# Patient Record
Sex: Female | Born: 1950 | Race: Black or African American | Hispanic: No | Marital: Married | Smoking: Former smoker
Health system: Southern US, Community
[De-identification: ages and names within clinical notes are randomized; demographics above are authoritative.]

## PROBLEM LIST (undated history)

## (undated) DIAGNOSIS — K219 Gastro-esophageal reflux disease without esophagitis: Secondary | ICD-10-CM

## (undated) DIAGNOSIS — M1990 Unspecified osteoarthritis, unspecified site: Secondary | ICD-10-CM

## (undated) DIAGNOSIS — E785 Hyperlipidemia, unspecified: Secondary | ICD-10-CM

## (undated) HISTORY — PX: TUBAL LIGATION: SHX77

## (undated) HISTORY — PX: TONSILLECTOMY: SUR1361

---

## 1999-04-20 ENCOUNTER — Emergency Department (HOSPITAL_COMMUNITY): Admission: EM | Admit: 1999-04-20 | Discharge: 1999-04-20 | Payer: Self-pay | Admitting: Emergency Medicine

## 2000-03-04 ENCOUNTER — Ambulatory Visit (HOSPITAL_COMMUNITY): Admission: RE | Admit: 2000-03-04 | Discharge: 2000-03-04 | Payer: Self-pay | Admitting: Family Medicine

## 2000-03-04 ENCOUNTER — Encounter: Payer: Self-pay | Admitting: Family Medicine

## 2003-12-22 ENCOUNTER — Other Ambulatory Visit: Admission: RE | Admit: 2003-12-22 | Discharge: 2003-12-22 | Payer: Self-pay | Admitting: Family Medicine

## 2004-01-19 ENCOUNTER — Ambulatory Visit (HOSPITAL_COMMUNITY): Admission: RE | Admit: 2004-01-19 | Discharge: 2004-01-19 | Payer: Self-pay | Admitting: Family Medicine

## 2004-03-27 ENCOUNTER — Ambulatory Visit (HOSPITAL_COMMUNITY): Admission: RE | Admit: 2004-03-27 | Discharge: 2004-03-27 | Payer: Self-pay | Admitting: Gastroenterology

## 2005-02-22 ENCOUNTER — Emergency Department (HOSPITAL_COMMUNITY): Admission: EM | Admit: 2005-02-22 | Discharge: 2005-02-22 | Payer: Self-pay | Admitting: Emergency Medicine

## 2005-03-12 ENCOUNTER — Other Ambulatory Visit: Admission: RE | Admit: 2005-03-12 | Discharge: 2005-03-12 | Payer: Self-pay | Admitting: Family Medicine

## 2006-07-28 ENCOUNTER — Other Ambulatory Visit: Admission: RE | Admit: 2006-07-28 | Discharge: 2006-07-28 | Payer: Self-pay | Admitting: Family Medicine

## 2007-07-30 ENCOUNTER — Other Ambulatory Visit: Admission: RE | Admit: 2007-07-30 | Discharge: 2007-07-30 | Payer: Self-pay | Admitting: Family Medicine

## 2007-08-10 ENCOUNTER — Ambulatory Visit (HOSPITAL_COMMUNITY): Admission: RE | Admit: 2007-08-10 | Discharge: 2007-08-10 | Payer: Self-pay | Admitting: Family Medicine

## 2010-03-18 ENCOUNTER — Encounter: Admission: RE | Admit: 2010-03-18 | Discharge: 2010-03-18 | Payer: Self-pay | Admitting: Advanced Practice Midwife

## 2011-02-03 ENCOUNTER — Other Ambulatory Visit: Payer: Self-pay | Admitting: Family Medicine

## 2011-02-03 ENCOUNTER — Other Ambulatory Visit (HOSPITAL_COMMUNITY): Payer: Self-pay | Admitting: Family Medicine

## 2011-02-03 ENCOUNTER — Other Ambulatory Visit (HOSPITAL_COMMUNITY)
Admission: RE | Admit: 2011-02-03 | Discharge: 2011-02-03 | Disposition: A | Discharge status: Home / Self Care | Payer: BC Managed Care – PPO | Source: Ambulatory Visit | Attending: Family Medicine | Admitting: Family Medicine

## 2011-02-03 DIAGNOSIS — Z124 Encounter for screening for malignant neoplasm of cervix: Secondary | ICD-10-CM | POA: Insufficient documentation

## 2011-02-03 DIAGNOSIS — Z1231 Encounter for screening mammogram for malignant neoplasm of breast: Secondary | ICD-10-CM

## 2011-02-05 ENCOUNTER — Ambulatory Visit (HOSPITAL_COMMUNITY)
Admission: RE | Admit: 2011-02-05 | Discharge: 2011-02-05 | Disposition: A | Discharge status: Home / Self Care | Payer: BC Managed Care – PPO | Source: Ambulatory Visit | Attending: Family Medicine | Admitting: Family Medicine

## 2011-02-05 DIAGNOSIS — Z1231 Encounter for screening mammogram for malignant neoplasm of breast: Secondary | ICD-10-CM | POA: Insufficient documentation

## 2011-02-21 NOTE — Op Note (Signed)
NAME:  Lori, Shah                             ACCOUNT NO.:  [id number]   MEDICAL RECORD NO.:  [id number]                   PATIENT TYPE:  AMB   LOCATION:  ENDO                                 FACILITY:  [facility]   PHYSICIAN:  Anselmo Rod, M.D.               DATE OF BIRTH:  June 05, 1951   DATE OF PROCEDURE:  03/27/2004  DATE OF DISCHARGE:                                 OPERATIVE REPORT   PROCEDURE PERFORMED:  Screening colonoscopy.   ENDOSCOPIST:  Charna Elizabeth, M.D.   INSTRUMENT USED:  Olympus video colonoscope.   INDICATIONS FOR PROCEDURE:  Guaiac positive stool in a 60 year old [place]-  American female.  Rule out colonic polyps, masses, etc.   PREPROCEDURE PREPARATION:  Informed consent was procured from the patient.  The patient was fasted for eight hours prior to the procedure and prepped  with a bottle of magnesium citrate and a gallon of GoLYTELY the night prior  to the procedure.   PREPROCEDURE PHYSICAL:  The patient had stable vital signs.  Neck supple.  Chest clear to auscultation.  S1 and S2 regular.  Abdomen soft with normal  bowel sounds.   DESCRIPTION OF PROCEDURE:  The patient was placed in left lateral decubitus  position and sedated with 50 mg of Demerol and 7 mg of Versed intravenously.  Once the patient was adequately sedated and maintained on low flow oxygen  and continuous cardiac monitoring, the Olympus video colonoscope was  advanced from the rectum to the cecum.  The appendicular orifice and  ileocecal valve were clearly visualized and photographed.  No masses,  polyps, erosions, ulcerations or diverticula were seen.  Retroflexion in the  rectum revealed no abnormalities.   IMPRESSION:  Normal colonoscopy up to the cecum.   RECOMMENDATIONS:  1. Repeat colonoscopy is recommended in the next 10 years unless the patient     develops any abnormal symptoms     in the interim.  2. Outpatient followup in the next two weeks for repeat guaiac testing,  further recommendations will be made at that time.                                               Anselmo Rod, M.D.    JNM/MEDQ  D:  03/27/2004  T:  03/28/2004  Job:  [id number]   cc:   Gretta Arab. Valentina Lucks, M.D.  [address] [place]  [place] [address]  Fax: [phone]

## 2011-10-25 ENCOUNTER — Encounter (HOSPITAL_COMMUNITY): Payer: Self-pay

## 2011-10-25 ENCOUNTER — Observation Stay (HOSPITAL_COMMUNITY)
Admission: EM | Admit: 2011-10-25 | Discharge: 2011-10-25 | Disposition: A | Discharge status: Home / Self Care | Payer: BC Managed Care – PPO | Attending: Emergency Medicine | Admitting: Emergency Medicine

## 2011-10-25 DIAGNOSIS — R3589 Other polyuria: Secondary | ICD-10-CM | POA: Insufficient documentation

## 2011-10-25 DIAGNOSIS — R631 Polydipsia: Secondary | ICD-10-CM | POA: Insufficient documentation

## 2011-10-25 DIAGNOSIS — R739 Hyperglycemia, unspecified: Secondary | ICD-10-CM

## 2011-10-25 DIAGNOSIS — R7309 Other abnormal glucose: Principal | ICD-10-CM | POA: Insufficient documentation

## 2011-10-25 DIAGNOSIS — R358 Other polyuria: Secondary | ICD-10-CM | POA: Insufficient documentation

## 2011-10-25 LAB — URINALYSIS, ROUTINE W REFLEX MICROSCOPIC
Bilirubin Urine: NEGATIVE
Ketones, ur: 40 mg/dL — AB
Nitrite: NEGATIVE
Protein, ur: NEGATIVE mg/dL
Urobilinogen, UA: 0.2 mg/dL (ref 0.0–1.0)
pH: 5.5 (ref 5.0–8.0)

## 2011-10-25 LAB — CBC
HCT: 41.6 % (ref 36.0–46.0)
Hemoglobin: 14.9 g/dL (ref 12.0–15.0)
MCH: 28.8 pg (ref 26.0–34.0)
MCHC: 35.8 g/dL (ref 30.0–36.0)
RDW: 12.1 % (ref 11.5–15.5)

## 2011-10-25 LAB — URINE CULTURE: Culture  Setup Time: 201301191959

## 2011-10-25 LAB — GLUCOSE, CAPILLARY
Glucose-Capillary: 298 mg/dL — ABNORMAL HIGH (ref 70–99)
Glucose-Capillary: 233 mg/dL — ABNORMAL HIGH (ref 70–99)
Glucose-Capillary: 179 mg/dL — ABNORMAL HIGH (ref 70–99)

## 2011-10-25 LAB — BASIC METABOLIC PANEL
BUN: 11 mg/dL (ref 6–23)
Calcium: 9.8 mg/dL (ref 8.4–10.5)
Creatinine, Ser: 0.86 mg/dL (ref 0.50–1.10)
GFR calc Af Amer: 82 mL/min — ABNORMAL LOW (ref >90)
GFR calc non Af Amer: 71 mL/min — ABNORMAL LOW (ref >90)

## 2011-10-25 LAB — DIFFERENTIAL
Basophils Absolute: 0 10*3/uL (ref 0.0–0.1)
Basophils Relative: 0 % (ref 0–1)
Eosinophils Absolute: 0.1 10*3/uL (ref 0.0–0.7)
Eosinophils Relative: 1 % (ref 0–5)
Monocytes Absolute: 0.4 10*3/uL (ref 0.1–1.0)
Monocytes Relative: 5 % (ref 3–12)

## 2011-10-25 LAB — URINE MICROSCOPIC-ADD ON

## 2011-10-25 LAB — GLUCOSE, RANDOM: Glucose, Bld: 418 mg/dL — ABNORMAL HIGH (ref 70–99)

## 2011-10-25 MED ORDER — ONDANSETRON HCL 4 MG/2ML IJ SOLN: 4.0000 mg | Freq: Four times a day (QID) | INTRAMUSCULAR | Status: DC | PRN

## 2011-10-25 MED ORDER — SODIUM CHLORIDE 0.9 % IV BOLUS (SEPSIS)
1000.0000 mL | Freq: Once | INTRAVENOUS | Status: AC
  Administered 2011-10-25: 1000 mL via INTRAVENOUS

## 2011-10-25 MED ORDER — INSULIN ASPART 100 UNIT/ML ~~LOC~~ SOLN
10.0000 [IU] | Freq: Once | SUBCUTANEOUS | Status: AC
  Administered 2011-10-25: 10 [IU] via SUBCUTANEOUS
  Filled 2011-10-25 (×2): qty 1

## 2011-10-25 MED ORDER — ONDANSETRON 8 MG/NS 50 ML IVPB
8.0000 mg | INTRAVENOUS | Status: AC
  Administered 2011-10-25: 8 mg via INTRAVENOUS
  Filled 2011-10-25: qty 8

## 2011-10-25 MED ORDER — METFORMIN HCL 1000 MG PO TABS: 500.0000 mg | ORAL_TABLET | Freq: Two times a day (BID) | ORAL | Status: DC

## 2011-10-25 MED ORDER — GI COCKTAIL ~~LOC~~
30.0000 mL | Freq: Three times a day (TID) | ORAL | Status: DC | PRN
  Administered 2011-10-25: 30 mL via ORAL
  Filled 2011-10-25: qty 30

## 2011-10-25 MED ORDER — SODIUM CHLORIDE 0.9 % IV SOLN
INTRAVENOUS | Status: DC
  Administered 2011-10-25: 3.6 [IU]/h via INTRAVENOUS
  Filled 2011-10-25: qty 1

## 2011-10-25 MED ORDER — ONDANSETRON 8 MG/NS 50 ML IVPB: 8.0000 mg | Freq: Three times a day (TID) | INTRAVENOUS | Status: DC | PRN

## 2011-10-25 MED ORDER — SODIUM CHLORIDE 0.9 % IV BOLUS (SEPSIS)
250.0000 mL | Freq: Once | INTRAVENOUS | Status: AC
  Administered 2011-10-25: 1000 mL via INTRAVENOUS

## 2011-10-25 MED ORDER — SODIUM CHLORIDE 0.9 % IV SOLN: Freq: Once | INTRAVENOUS | Status: DC

## 2011-10-25 NOTE — ED Notes (Signed)
Glucostabilizer Dc'd per PA Green due to pt glucose 179

## 2011-10-25 NOTE — ED Notes (Signed)
Verified insulin rate 3.6 units/hr with Dorice Lamas, RN

## 2011-10-25 NOTE — ED Notes (Signed)
Reported off to [facility] at CDU.  Patient transferred to CDU and explained the reason.

## 2011-10-25 NOTE — ED Notes (Addendum)
1725 - verified with Dorice Lamas, RN, - Insulin rate change to 2.4 units/hr as per glucose stabilizer

## 2011-10-25 NOTE — ED Provider Notes (Signed)
History     CSN: [id number]  Arrival date & time 10/25/11  1318   First MD Initiated Contact with Patient 10/25/11 1406      No chief complaint on file.   (Consider location/radiation/quality/duration/timing/severity/associated sxs/prior treatment) The history is provided by the patient.   Patient here with polyuria, polydipsia. Symptoms have been for the past 3 weeks saw her physician today was noted to be hyperglycemic in the office and patient was transported here. Patient has history of recent vaginal yeast infection. Also notes diffuse whole-body weakness. No fever, vomiting, diarrhea. No chest abdominal pain. Slight headache without photophobia. No prior history of diabetes. Nothing makes her symptoms better or worse no medications used prior to arrival History reviewed. No pertinent past medical history.  History reviewed. No pertinent past surgical history.  History reviewed. No pertinent family history.  History  Substance Use Topics  . Smoking status: Not on file  . Smokeless tobacco: Not on file  . Alcohol Use: Not on file    OB History    Grav Para Term Preterm Abortions TAB SAB Ect Mult Living                  Review of Systems  All other systems reviewed and are negative.    Allergies  Review of patient's allergies indicates not on file.  Home Medications  No current outpatient prescriptions on file.  BP 119/73  Pulse 85  Temp(Src) 98.2 F (36.8 C) (Oral)  Resp 18  SpO2 98%  Physical Exam  Nursing note and vitals reviewed. Constitutional: She is oriented to person, place, and time. Vital signs are normal. She appears well-developed and well-nourished.  Non-toxic appearance. No distress.  HENT:  Head: Normocephalic and atraumatic.  Eyes: Conjunctivae, EOM and lids are normal. Pupils are equal, round, and reactive to light.  Neck: Normal range of motion. Neck supple. No tracheal deviation present. No mass present.  Cardiovascular: Normal rate,  regular rhythm and normal heart sounds.  Exam reveals no gallop.   No murmur heard. Pulmonary/Chest: Effort normal and breath sounds normal. No stridor. No respiratory distress. She has no decreased breath sounds. She has no wheezes. She has no rhonchi. She has no rales.  Abdominal: Soft. Normal appearance and bowel sounds are normal. She exhibits no distension. There is no tenderness. There is no rebound and no CVA tenderness.  Musculoskeletal: Normal range of motion. She exhibits no edema and no tenderness.  Neurological: She is alert and oriented to person, place, and time. She has normal strength. No cranial nerve deficit or sensory deficit. GCS eye subscore is 4. GCS verbal subscore is 5. GCS motor subscore is 6.  Skin: Skin is warm and dry. No abrasion and no rash noted.  Psychiatric: She has a normal mood and affect. Her speech is normal and behavior is normal.    ED Course  Procedures (including critical care time)  Labs Reviewed  GLUCOSE, CAPILLARY - Abnormal; Notable for the following:    Glucose-Capillary 517 (*)    All other components within normal limits  CBC  DIFFERENTIAL  BASIC METABOLIC PANEL  URINALYSIS, ROUTINE W REFLEX MICROSCOPIC  URINE CULTURE   No results found.   No diagnosis found.    MDM  Pt placed on hyperglycemia protocol in cdu and will be given iv fluids and will be discharged on metformin when blood sugar is controlled        Toy Baker, MD 10/28/11 [phone]

## 2011-10-25 NOTE — ED Notes (Signed)
Sent from md for elevated blood sugar, this week dizzy and white tongue, no hx of diabetes.

## 2011-10-25 NOTE — ED Notes (Signed)
Received report on Lori Shah from [employer], Charity fundraiser. Pt coming to cdu#9.

## 2011-10-25 NOTE — ED Notes (Signed)
Insulin drip and rate of infusion verified by Dionicio Stall, RN and Dorice Lamas, RN.

## 2011-10-25 NOTE — ED Notes (Signed)
Dc instructions given and understanding verbalized 

## 2011-10-25 NOTE — ED Provider Notes (Signed)
The patient was placed in the CDU by Dr. Bruce Donath. Patient is here for hyperglycemia protocol. Patient has been tolerating treatment well during her stay in the CDU. Her blood sugar is now 179 by cbg. Patient is now asymptomatic and says she feels great. Will start patient on metformin 500 mg twice a day and is to followup with her primary care doctor on Monday.   Dorthula Matas, PA 10/25/11 1958

## 2012-03-27 ENCOUNTER — Encounter (HOSPITAL_COMMUNITY): Payer: Self-pay | Admitting: Emergency Medicine

## 2012-03-27 ENCOUNTER — Emergency Department (HOSPITAL_COMMUNITY): Payer: Self-pay

## 2012-03-27 ENCOUNTER — Emergency Department (HOSPITAL_COMMUNITY)
Admission: EM | Admit: 2012-03-27 | Discharge: 2012-03-27 | Disposition: A | Discharge status: Home / Self Care | Payer: Self-pay | Attending: Emergency Medicine | Admitting: Emergency Medicine

## 2012-03-27 DIAGNOSIS — IMO0002 Reserved for concepts with insufficient information to code with codable children: Secondary | ICD-10-CM

## 2012-03-27 DIAGNOSIS — S91109A Unspecified open wound of unspecified toe(s) without damage to nail, initial encounter: Secondary | ICD-10-CM | POA: Insufficient documentation

## 2012-03-27 DIAGNOSIS — Z880 Allergy status to penicillin: Secondary | ICD-10-CM | POA: Insufficient documentation

## 2012-03-27 DIAGNOSIS — F17200 Nicotine dependence, unspecified, uncomplicated: Secondary | ICD-10-CM | POA: Insufficient documentation

## 2012-03-27 DIAGNOSIS — S92911A Unspecified fracture of right toe(s), initial encounter for closed fracture: Secondary | ICD-10-CM

## 2012-03-27 DIAGNOSIS — E1149 Type 2 diabetes mellitus with other diabetic neurological complication: Secondary | ICD-10-CM | POA: Insufficient documentation

## 2012-03-27 DIAGNOSIS — E1142 Type 2 diabetes mellitus with diabetic polyneuropathy: Secondary | ICD-10-CM | POA: Insufficient documentation

## 2012-03-27 DIAGNOSIS — M2010 Hallux valgus (acquired), unspecified foot: Secondary | ICD-10-CM | POA: Insufficient documentation

## 2012-03-27 DIAGNOSIS — S92919B Unspecified fracture of unspecified toe(s), initial encounter for open fracture: Secondary | ICD-10-CM | POA: Insufficient documentation

## 2012-03-27 DIAGNOSIS — W208XXA Other cause of strike by thrown, projected or falling object, initial encounter: Secondary | ICD-10-CM | POA: Insufficient documentation

## 2012-03-27 MED ORDER — BACITRACIN 500 UNIT/GM EX OINT
1.0000 "application " | TOPICAL_OINTMENT | Freq: Two times a day (BID) | CUTANEOUS | Status: DC
  Filled 2012-03-27: qty 0.9

## 2012-03-27 MED ORDER — BACITRACIN ZINC 500 UNIT/GM EX OINT
TOPICAL_OINTMENT | Freq: Two times a day (BID) | CUTANEOUS | Status: DC
  Filled 2012-03-27: qty 15

## 2012-03-27 MED ORDER — CHLORHEXIDINE GLUCONATE 4 % EX LIQD
Freq: Two times a day (BID) | CUTANEOUS | Status: DC
  Administered 2012-03-27: 17:00:00 via TOPICAL
  Filled 2012-03-27: qty 15

## 2012-03-27 MED ORDER — INDOMETHACIN 25 MG PO CAPS
50.0000 mg | ORAL_CAPSULE | Freq: Once | ORAL | Status: AC
  Administered 2012-03-27: 50 mg via ORAL
  Filled 2012-03-27: qty 1
  Filled 2012-03-27: qty 2

## 2012-03-27 MED ORDER — CEPHALEXIN 250 MG PO CAPS
500.0000 mg | ORAL_CAPSULE | Freq: Once | ORAL | Status: AC
  Administered 2012-03-27: 500 mg via ORAL
  Filled 2012-03-27: qty 2

## 2012-03-27 MED ORDER — CEPHALEXIN 500 MG PO CAPS: 500.0000 mg | ORAL_CAPSULE | Freq: Four times a day (QID) | ORAL | Status: AC

## 2012-03-27 MED ORDER — HYDROCODONE-ACETAMINOPHEN 5-325 MG PO TABS
2.0000 | ORAL_TABLET | Freq: Once | ORAL | Status: AC
  Administered 2012-03-27: 2 via ORAL
  Filled 2012-03-27: qty 2

## 2012-03-27 MED ORDER — CHLORHEXIDINE GLUCONATE 4 % EX LIQD: Freq: Two times a day (BID) | CUTANEOUS | Status: AC

## 2012-03-27 MED ORDER — HYDROCODONE-ACETAMINOPHEN 5-325 MG PO TABS: 2.0000 | ORAL_TABLET | Freq: Four times a day (QID) | ORAL | Status: AC | PRN

## 2012-03-27 NOTE — Progress Notes (Signed)
Orthopedic Tech Progress Note Patient Details:  Lori Shah 1951-06-24 [id number]  Ortho Devices Type of Ortho Device: Postop boot Ortho Device/Splint Location: right foot Ortho Device/Splint Interventions: Application   [name] 03/27/2012, 4:42 PM

## 2012-03-27 NOTE — ED Notes (Signed)
Pt reports she dropped a stereo speaker on her right foot. Pt presents with guaze around right great toe.

## 2012-03-27 NOTE — ED Notes (Signed)
Lac cleaned on right foot great toe with chlorhexidine, dry sterile 4x4 applied wrapped and dressed.

## 2012-03-27 NOTE — ED Notes (Signed)
Alert and oriented, noted with minimal bleeding and bruising on right toe, gauze in place. Right extremity elevated, awaiting orders.

## 2012-03-27 NOTE — Discharge Instructions (Signed)
Toe Fracture  Your caregiver has diagnosed you as having a fractured toe. A toe fracture is a break in the bone of a toe. "Buddy taping" is a way of splinting your broken toe, by taping the broken toe to the toe next to it. This "buddy taping" will keep the injured toe from moving beyond normal range of motion. Buddy taping also helps the toe heal in a more normal alignment. It may take 6 to 8 weeks for the toe injury to heal.  HOME CARE INSTRUCTIONS   · Leave your toes taped together for as long as directed by your caregiver or until you see a doctor for a follow-up examination. You can change the tape after bathing. Always use a small piece of gauze or cotton between the toes when taping them together. This will help the skin stay dry and prevent infection.  · Apply ice to the injury for 15 to 20 minutes each hour while awake for the first 2 days. Put the ice in a plastic bag and place a towel between the bag of ice and your skin.  · After the first 2 days, apply heat to the injured area. Use heat for the next 2 to 3 days. Place a heating pad on the foot or soak the foot in warm water as directed by your caregiver.  · Keep your foot elevated as much as possible to lessen swelling.  · Wear sturdy, supportive shoes. The shoes should not pinch the toes or fit tightly against the toes.  · Your caregiver may prescribe a rigid shoe if your foot is very swollen.  · Your may be given crutches if the pain is too great and it hurts too much to walk.  · Only take over-the-counter or prescription medicines for pain, discomfort, or fever as directed by your caregiver.  · If your caregiver has given you a follow-up appointment, it is very important to keep that appointment. Not keeping the appointment could result in a chronic or permanent injury, pain, and disability. If there is any problem keeping the appointment, you must call back to this facility for assistance.  SEEK MEDICAL CARE IF:   · You have increased pain or  swelling, not relieved with medications.  · The pain does not get better after 1 week.  · Your injured toe is cold when the others are warm.  SEEK IMMEDIATE MEDICAL CARE IF:   · The toe becomes cold, numb, or white.  · The toe becomes hot (inflamed) and red.  Document Released: 09/19/2000 Document Revised: 09/11/2011 Document Reviewed: 05/08/2008  ExitCare® Patient Information ©2012 [employer].

## 2012-03-27 NOTE — ED Provider Notes (Signed)
History   This chart was scribed for Lori Numbers, MD scribed by Magnus Sinning. The patient was seen in room TR02C/TR02C seen at 13:56    CSN: [id number]  Arrival date & time 03/27/12  1148   First MD Initiated Contact with Patient 03/27/12 1301      Chief Complaint  Patient presents with  . Laceration    right big toe    (Consider location/radiation/quality/duration/timing/severity/associated sxs/prior treatment) HPI Lori Shah is a 61 y.o. female who presents to the Emergency Department complaining of injury to  great toe on her right foot, onset approximately 2 hours. She says she dropped a speaker on her foot this morning, but reports the pain is a 0/10 currently,due to associated numbness. Patient says she has history of DM, but denies DM neuropathy in foot. States she had a tetanus approximately 4 months ago. Denies any other injuries, or LOC.  Past Medical History  Diagnosis Date  . Diabetes mellitus     Past Surgical History  Procedure Date  . Tubal ligation   . Tonsillectomy     History reviewed. No pertinent family history.  History  Substance Use Topics  . Smoking status: Current Everyday Smoker  . Smokeless tobacco: Not on file  . Alcohol Use: No    Review of Systems 10 Systems reviewed and are negative for acute change except as noted in the HPI. Allergies  Other and Penicillins  Home Medications   Current Outpatient Rx  Name Route Sig Dispense Refill  . METFORMIN HCL 1000 MG PO TABS Oral Take 1,000 mg by mouth 2 (two) times daily with a meal.    . OMEPRAZOLE 40 MG PO CPDR Oral Take 40 mg by mouth daily.      BP 131/84  Pulse 79  Temp 98.3 F (36.8 C) (Oral)  Resp 16  SpO2 99%  Physical Exam  Nursing note and vitals reviewed.  GEN: Well-developed, well-nourished female in  distress HEENT: Atraumatic, normocephalic. Oropharynx clear without erythema EYES: PERRLA BL, no scleral icterus. NECK: Trachea midline, no meningismus CV: regular  rate and rhythm. PULM: No respiratory distress.  No crackles, wheezes, or rales. Neuro: cranial nerves grossly 2-12 intact, no abnormalities of strength or sensation, A and O x 3 MSK: 1 inch horizontally oriented laceration over the dorsum of the left great toe, proximal to nail bed. Skin: No rashes petechiae, purpura, or jaundice Psych: no abnormality of mood  ED Course  Procedures (including critical care time) DIAGNOSTIC STUDIES: Oxygen Saturation is 99% on room air, normal by my interpretation.    COORDINATION OF CARE: 16:10: EDMD notes laceration procedure completed by PA. Patient provided abx for d/c.  Dg Toe Great Left  03/27/2012  *RADIOLOGY REPORT*  Clinical Data: Crush injury to the left great toe.  LEFT GREAT TOE 3 VIEWS  Comparison: None.  Findings: Comminuted fracture involving the distal tuft of the distal phalanx.  No other fractures.  Mild hallux valgus. Hypertrophic spurring involving the head of the first metatarsal.  IMPRESSION: Comminuted fracture involving the distal tuft of the distal phalanx.  Original Report Authenticated By: Arnell Sieving, M.D.     1. Open fracture of distal phalangeal tuft   2. Toe fracture, left     LACERATION REPAIR Performed by: Carolee Rota Authorized by: Carolee Rota Consent: Verbal consent obtained. Risks and benefits: risks, benefits and alternatives were discussed Consent given by: patient Patient identity confirmed: provided demographic data Prepped and Draped in normal sterile fashion Wound explored  Laceration Location: R great toe  Laceration Length: 1 inch  No Foreign Bodies seen or palpated  Anesthesia: local infiltration  Local anesthetic: lidocaine 2% without epinephrine  Anesthetic total: 5 ml  Irrigation method: syringe with splash guard Amount of cleaning: standard  Skin closure: Ethilon 4-0  Number of sutures: 3  Technique: simple interrupted  Patient tolerance: Patient tolerated the  procedure well with no immediate complications.   MDM  Patient was evaluated by myself. Based upon evaluation plain film of the foot was performed. This did show an tuft fracture which raised the concern of an open fracture. The fracture was actually of the tuft of the distal phalanx whereas the laceration was proximal to the nail bed. Laceration repair was performed by my PA and supervised by myself with reassessment following procedure...  patient received 2 Vicodin for pain control. Her tetanus shot was up-to-date. Given concern for open fracture Keflex was provided. Patient also had back surgeries and applied following procedure. Patient had bandage with postop boot applied. Ice pack was provided. Patient was given prescription for chlorhexidine to clean the wound twice a day. She was also given 10 days of Keflex as well as Vicodin for pain control. Patient was told to followup with her PCP on Monday to ensure that she does not have any signs of infection despite antibiotics. She will require suture removal in 7-10 days. Given that her nail was slightly disrupted she was told that she should likely followup with her podiatrist for removal of sutures in case they feel that any further interventions regarding her nail are necessary. Patient was discharged in good condition.  I personally performed the services described in this documentation, which was scribed in my presence. The recorded information has been reviewed and considered.           Lori Numbers, MD 03/27/12 2101

## 2012-11-09 ENCOUNTER — Other Ambulatory Visit (HOSPITAL_COMMUNITY): Payer: Self-pay | Admitting: Family Medicine

## 2012-11-09 DIAGNOSIS — Z1231 Encounter for screening mammogram for malignant neoplasm of breast: Secondary | ICD-10-CM

## 2012-11-18 ENCOUNTER — Ambulatory Visit (HOSPITAL_COMMUNITY): Admission: RE | Admit: 2012-11-18 | Payer: BC Managed Care – PPO | Source: Ambulatory Visit

## 2012-12-01 ENCOUNTER — Ambulatory Visit (HOSPITAL_COMMUNITY)
Admission: RE | Admit: 2012-12-01 | Discharge: 2012-12-01 | Disposition: A | Discharge status: Home / Self Care | Payer: BC Managed Care – PPO | Source: Ambulatory Visit | Attending: Family Medicine | Admitting: Family Medicine

## 2012-12-01 DIAGNOSIS — Z1231 Encounter for screening mammogram for malignant neoplasm of breast: Secondary | ICD-10-CM | POA: Insufficient documentation

## 2013-02-27 ENCOUNTER — Inpatient Hospital Stay (HOSPITAL_COMMUNITY)
Admission: EM | Admit: 2013-02-27 | Discharge: 2013-03-01 | DRG: 204 | Disposition: A | Discharge status: Home / Self Care | Payer: BC Managed Care – PPO | Attending: Internal Medicine | Admitting: Internal Medicine

## 2013-02-27 ENCOUNTER — Encounter (HOSPITAL_COMMUNITY): Payer: Self-pay | Admitting: *Deleted

## 2013-02-27 ENCOUNTER — Emergency Department (HOSPITAL_COMMUNITY): Payer: BC Managed Care – PPO

## 2013-02-27 DIAGNOSIS — K859 Acute pancreatitis without necrosis or infection, unspecified: Principal | ICD-10-CM | POA: Diagnosis present

## 2013-02-27 DIAGNOSIS — E119 Type 2 diabetes mellitus without complications: Secondary | ICD-10-CM | POA: Diagnosis present

## 2013-02-27 DIAGNOSIS — D219 Benign neoplasm of connective and other soft tissue, unspecified: Secondary | ICD-10-CM

## 2013-02-27 DIAGNOSIS — E1169 Type 2 diabetes mellitus with other specified complication: Secondary | ICD-10-CM

## 2013-02-27 DIAGNOSIS — E785 Hyperlipidemia, unspecified: Secondary | ICD-10-CM | POA: Diagnosis present

## 2013-02-27 DIAGNOSIS — K219 Gastro-esophageal reflux disease without esophagitis: Secondary | ICD-10-CM | POA: Diagnosis present

## 2013-02-27 DIAGNOSIS — K5730 Diverticulosis of large intestine without perforation or abscess without bleeding: Secondary | ICD-10-CM

## 2013-02-27 HISTORY — DX: Type 2 diabetes mellitus without complications: E11.9

## 2013-02-27 HISTORY — DX: Hyperlipidemia, unspecified: E78.5

## 2013-02-27 HISTORY — DX: Gastro-esophageal reflux disease without esophagitis: K21.9

## 2013-02-27 HISTORY — DX: Type 2 diabetes mellitus with other specified complication: E11.69

## 2013-02-27 LAB — COMPREHENSIVE METABOLIC PANEL
Albumin: 4.2 g/dL (ref 3.5–5.2)
BUN: 10 mg/dL (ref 6–23)
Creatinine, Ser: 1.15 mg/dL — ABNORMAL HIGH (ref 0.50–1.10)
Potassium: 4.2 mEq/L (ref 3.5–5.1)
Total Protein: 8.2 g/dL (ref 6.0–8.3)

## 2013-02-27 LAB — URINALYSIS, ROUTINE W REFLEX MICROSCOPIC
Leukocytes, UA: NEGATIVE
Nitrite: NEGATIVE
Specific Gravity, Urine: 1.021 (ref 1.005–1.030)
pH: 7 (ref 5.0–8.0)

## 2013-02-27 LAB — POCT I-STAT TROPONIN I

## 2013-02-27 LAB — CBC WITH DIFFERENTIAL/PLATELET
Basophils Relative: 0 % (ref 0–1)
Eosinophils Absolute: 0.1 10*3/uL (ref 0.0–0.7)
Hemoglobin: 14.7 g/dL (ref 12.0–15.0)
MCH: 29.1 pg (ref 26.0–34.0)
MCHC: 35.3 g/dL (ref 30.0–36.0)
Monocytes Absolute: 0.9 10*3/uL (ref 0.1–1.0)
Monocytes Relative: 6 % (ref 3–12)
Neutrophils Relative %: 72 % (ref 43–77)
RDW: 13.2 % (ref 11.5–15.5)

## 2013-02-27 LAB — LIPASE, BLOOD: Lipase: 119 U/L — ABNORMAL HIGH (ref 11–59)

## 2013-02-27 MED ORDER — IOHEXOL 300 MG/ML  SOLN
100.0000 mL | Freq: Once | INTRAMUSCULAR | Status: AC | PRN
  Administered 2013-02-27: 100 mL via INTRAVENOUS

## 2013-02-27 MED ORDER — MORPHINE SULFATE 2 MG/ML IJ SOLN
2.0000 mg | INTRAMUSCULAR | Status: DC | PRN
  Administered 2013-02-27 – 2013-02-28 (×3): 2 mg via INTRAVENOUS
  Filled 2013-02-27 (×4): qty 1

## 2013-02-27 MED ORDER — SODIUM CHLORIDE 0.9 % IV SOLN
INTRAVENOUS | Status: DC
  Administered 2013-02-27: 18:00:00 via INTRAVENOUS

## 2013-02-27 MED ORDER — IOHEXOL 300 MG/ML  SOLN: 50.0000 mL | Freq: Once | INTRAMUSCULAR | Status: AC | PRN

## 2013-02-27 MED ORDER — FENTANYL CITRATE 0.05 MG/ML IJ SOLN
100.0000 ug | Freq: Once | INTRAMUSCULAR | Status: DC
  Filled 2013-02-27: qty 2

## 2013-02-27 MED ORDER — ONDANSETRON HCL 4 MG/2ML IJ SOLN
4.0000 mg | Freq: Once | INTRAMUSCULAR | Status: AC
  Administered 2013-02-27: 4 mg via INTRAVENOUS
  Filled 2013-02-27: qty 2

## 2013-02-27 MED ORDER — FENTANYL CITRATE 0.05 MG/ML IJ SOLN
100.0000 ug | Freq: Once | INTRAMUSCULAR | Status: AC
  Administered 2013-02-27: 100 ug via INTRAVENOUS
  Filled 2013-02-27 (×2): qty 2

## 2013-02-27 NOTE — ED Notes (Signed)
ECG was shown to Dr. Adriana Simas.

## 2013-02-27 NOTE — ED Provider Notes (Signed)
Lori Shah S 8:00 PM patient discussed with Dr. Radford Pax. Patient with history of diabetes presenting with continued and worsens epigastric and upper abdominal pains. Found to have an elevated lipase. She is nondrinker. No significant right upper quadrant tenderness. CT scan pending. Plan for admission for observation for symptom control and evaluation of cause of suspected pancreatitis.   CT with signs of mild pancreatitis no obstructing stone. No other concerning findings. We'll call for observation admission.  [facility]oke with [facility]. He will see patient and write orders.   Diagnosis 1 acute pancreatitis 2 uterine fibroids 3 diverticula of colon     Angus Seller, PA-C 02/27/13 2137

## 2013-02-27 NOTE — H&P (Addendum)
[facility]  History and Physical  Lori Shah ZOX:[id number] DOB: 09-Nov-1950 DOA: 02/27/2013  Referring physician: Dr. Radford Pax PCP: Astrid Divine, MD   Chief Complaint: Abdominal Pain  HPI:  61 yr. Old AAF w/ hx Type 2 NIDDM, HL, GERD, presents with the above stated complaint. She states that approximately 4 days ago she began to notice a crampy epigastric pain that would come and go. She states she quickly noticed that this worsened with eating. She denies any vomiting, hematemesis, melena, or hematochezia. She admits to nausea and decreased appetite. She states she tried to take a laxative to make this better, but it persisted. She denies any recent changes to her medications, denies any alcohol use as well. She denies any previous occurrences. She denies any diarrhea, but admits to some constipation, with a recent BM yesterday. She was noted to have a lipase of 119 in the ED with notable epigastric pain.  Her WBC was noted to be 14k. Her AST/ALT were normal, but a slightly elevated ALP at 124.  A CT of her abdomen was performed which showed evidence of minimal focal pancreatitis involving the head and uncinate process. She had evidence of proximal sigmoid diverticuli without diverticulitis.  She was treated with IV narcotic therapy and IVF. She feels slightly better. EKG was reviewed and showed some T wave inversions in the inferiorlateral leads, no previous for comparison. She denies any CP at rest or with exertion. Denies DOE. Denies PND/orthopnea. An Istat trop I was 0.00.  Chart Review:  Reviewed.  Review of Systems:  Complete 12 point review of systems otherwise negative except for that stated in the HPI.  Past Medical History  Diagnosis Date  . Diabetes mellitus   . Hyperlipidemia   . GERD (gastroesophageal reflux disease)     Past Surgical History  Procedure Laterality Date  . Tubal ligation    . Tonsillectomy      Social History:  reports that she has been  smoking Cigarettes.  She has a 20 pack-year smoking history. She does not have any smokeless tobacco history on file. She reports that she does not drink alcohol or use illicit drugs.  Allergies  Allergen Reactions  . Other Other (See Comments)    Allergic to [place] nuts, pecans, etc - causes ear swelling  . Penicillins Hives and Swelling    History reviewed. No pertinent family history.   Prior to Admission medications   Medication Sig Start Date End Date Taking? Authorizing Provider  atorvastatin (LIPITOR) 20 MG tablet Take 20 mg by mouth daily.   Yes Historical Provider, MD  glimepiride (AMARYL) 4 MG tablet Take 4 mg by mouth daily before breakfast.   Yes Historical Provider, MD  metFORMIN (GLUCOPHAGE) 1000 MG tablet Take 1,000 mg by mouth 2 (two) times daily with a meal.   Yes Historical Provider, MD  omeprazole (PRILOSEC) 20 MG capsule Take 20 mg by mouth daily.   Yes Historical Provider, MD   Physical Exam: Filed Vitals:   02/27/13 2000 02/27/13 2100 02/27/13 2115 02/27/13 2130  BP: 120/56 115/60 122/63 115/97  Pulse: 59 64 67 64  Temp:      Resp:      SpO2: 100% 97% 92% 97%     General:  AAOx3, NAD.  Eyes: EOMI, PERRL, no icterus.  ENT: dry mucosa.  Neck: supple. No adenopathy.  Cardiovascular: S1S2, no m/r/g, RRR  Respiratory: CTA bilat.  Abdomen: Soft, moderate epigastric tenderness, no guarding, +BS, no distention, no palpable masses.  Skin: no echymosis.  Psychiatric: Appropriate.  Neurologic: CN II-XII intact, no focal deficits.  Wt Readings from Last 3 Encounters:  10/25/11 176 lb (79.833 kg)    Labs on Admission:  Basic Metabolic Panel:  Recent Labs Lab 02/27/13 1637  NA 136  K 4.2  CL 98  CO2 29  GLUCOSE 147*  BUN 10  CREATININE 1.15*  CALCIUM 10.3    Liver Function Tests:  Recent Labs Lab 02/27/13 1637  AST 15  ALT 15  ALKPHOS 124*  BILITOT 0.8  PROT 8.2  ALBUMIN 4.2    Recent Labs Lab 02/27/13 1637  LIPASE 119*    CBC:  Recent Labs Lab 02/27/13 1637  WBC 14.3*  NEUTROABS 10.3*  HGB 14.7  HCT 41.7  MCV 82.6  PLT 305    Cardiac Enzymes: No results found for this basename: CKTOTAL, CKMB, CKMBINDEX, TROPONINI,  in the last 168 hours  Troponin (Point of Care Test)  Recent Labs  02/27/13 1659  TROPIPOC 0.00    Radiological Exams on Admission: Ct Abdomen Pelvis W Contrast  02/27/2013   *RADIOLOGY REPORT*  Clinical Data: Unexplained abdominal pain.  Elevated serum lipase and alkaline phosphatase.  CT ABDOMEN AND PELVIS WITH CONTRAST  Technique:  Multidetector CT imaging of the abdomen and pelvis was performed following the standard protocol during bolus administration of intravenous contrast.  Contrast: OMNIPAQUE IOHEXOL 300 MG/ML IV. Oral contrast was also administered.  Comparison: No prior CT.  Abdominal ultrasound 06/19/2010.  Findings: Normal appearing liver with an anatomic variant in that the left lobe extends well across the midline into the left upper quadrant.  Minimal edema/inflammation adjacent to the head and uncinate of the pancreas.  Body and tail of the pancreas normal in appearance.  Normal appearing spleen, adrenal glands, and left kidney.  Approximate 3.4 x 4.4 cm parapelvic cyst involving the otherwise normal-appearing right kidney.  Gallbladder normal in appearance with a phrygian cap.  No biliary ductal dilation.  Normal-appearing stomach and small bowel.  Solitary diverticulum involving the proximal sigmoid colon without evidence of adjacent inflammation.  Remainder of the colon normal in appearance, containing liquid stool.  No ascites.  Appendix not visualized but no pericecal inflammation.  Numerous uterine fibroids, some which are calcified and degenerated.  Bilateral tubal ligation clips.  No adnexal masses or free pelvic fluid.  Phleboliths low in both sides of the low pelvis.  Urinary bladder decompressed and unremarkable.  Bone window images demonstrate degenerative  changes involving the lower thoracic and lumbar spine and the symphysis pubis. Visualized lung bases clear.  Heart size upper normal to slightly enlarged with aortic annular calcification.  IMPRESSION:  1.  Minimal focal pancreatitis involving the head and uncinate process of the pancreas. 2.  Uterine fibroids, some of which are calcified and degenerated. 3.  Solitary proximal sigmoid colon diverticulum without evidence of acute diverticulitis.   Original Report Authenticated By: Hulan Saas, M.D.    EKG: Independently reviewed. Anterior-lateral T wave inv. NSR.   Principal Problem:   Acute pancreatitis Active Problems:   Type 2 diabetes mellitus   Hyperlipidemia   Assessment/Plan 1. Acute pancreatitis: Will keep NPO. Start D51/2 NS at 125 cc/hr. Will order RUQ U/S to r/o evidence of cholelithiasis given description of pain and slightly elevated ALP. I will give her morphine for pain, she is opioid naive. Order triglyceride level.  2. Ant-Lat T wave changes: repeat Trop x 3. First one negative. I doubt cardiac etiology. CP free at this time.  3. NIDDM type 2: ISS. Hold PO hypoglycemics. 4. HL: Check trig. Hold statin for now. 5. Proph: lovenox.  Code Status: FULL Family Communication: Patient.  Time spent: 30 minutes  Jonah Blue, DO  [facility] Team 10  If 7PM-7AM, please contact night-coverage at [email or web address], password [facility] 02/27/2013, 10:07 PM

## 2013-02-27 NOTE — ED Provider Notes (Signed)
History     CSN: [id number]  Arrival date & time 02/27/13  1617   First MD Initiated Contact with Patient 02/27/13 1707      Chief Complaint  Patient presents with  . Abdominal Pain  . Dental Pain     HPI Pt is here with 4 day history of fluctuating abdominal pain that radiates to upper abdomen. No diarrhea, did take laxative, no change.  Denies fever chills.  Has had nausea but no vomiting.  Denies ulcers.  Denies change in bowel habits.  Denies alcohol use.  Past Medical History  Diagnosis Date  . Diabetes mellitus   . Hyperlipidemia   . GERD (gastroesophageal reflux disease)     Past Surgical History  Procedure Laterality Date  . Tubal ligation    . Tonsillectomy      History reviewed. No pertinent family history.  History  Substance Use Topics  . Smoking status: Current Every Day Smoker -- 0.50 packs/day for 40 years    Types: Cigarettes  . Smokeless tobacco: Not on file  . Alcohol Use: No    OB History   Grav Para Term Preterm Abortions TAB SAB Ect Mult Living                  Review of Systems All other systems reviewed and are negative Allergies  Other and Penicillins  Home Medications   Current Outpatient Rx  Name  Route  Sig  Dispense  Refill  . atorvastatin (LIPITOR) 20 MG tablet   Oral   Take 20 mg by mouth daily.         Marland Kitchen glimepiride (AMARYL) 4 MG tablet   Oral   Take 4 mg by mouth daily before breakfast.         . omeprazole (PRILOSEC) 20 MG capsule   Oral   Take 20 mg by mouth daily.         . metFORMIN (GLUCOPHAGE) 1000 MG tablet   Oral   Take 1 tablet (1,000 mg total) by mouth 2 (two) times daily with a meal. RESUME 1 tablet two times a day on 03/02/13           BP 127/74  Pulse 58  Temp(Src) 98.3 F (36.8 C) (Oral)  Resp 16  Ht 5\' 1"  (1.549 m)  Wt 178 lb 1.6 oz (80.786 kg)  BMI 33.67 kg/m2  SpO2 100%  Physical Exam  Nursing note and vitals reviewed. Constitutional: She is oriented to person, place, and  time. She appears well-developed and well-nourished. No distress.  HENT:  Head: Normocephalic and atraumatic.  Eyes: Pupils are equal, round, and reactive to light.  Neck: Normal range of motion.  Cardiovascular: Normal rate and intact distal pulses.   Pulmonary/Chest: No respiratory distress.  Abdominal: Normal appearance. She exhibits no distension and no mass. There is tenderness (Epigastric pain) in the epigastric area. There is no rebound and no guarding.    Musculoskeletal: Normal range of motion.  Neurological: She is alert and oriented to person, place, and time. No cranial nerve deficit.  Skin: Skin is warm and dry. No rash noted.  Psychiatric: She has a normal mood and affect. Her behavior is normal.    ED Course  Procedures (including critical care time) Medications  iohexol (OMNIPAQUE) 300 MG/ML solution 50 mL (not administered)  dextrose 5 %-0.45 % sodium chloride infusion ( Intravenous Rate/Dose Change 02/28/13 0845)  fentaNYL (SUBLIMAZE) injection 100 mcg (100 mcg Intravenous Given 02/27/13 1810)  ondansetron (ZOFRAN)  injection 4 mg (4 mg Intravenous Given 02/27/13 1800)  iohexol (OMNIPAQUE) 300 MG/ML solution 100 mL (100 mLs Intravenous Contrast Given 02/27/13 2017)     Results for orders placed during the hospital encounter of 02/27/13  CBC WITH DIFFERENTIAL      Result Value Range   WBC 14.3 (*) 4.0 - 10.5 K/uL   RBC 5.05  3.87 - 5.11 MIL/uL   Hemoglobin 14.7  12.0 - 15.0 g/dL   HCT 16.1  09.6 - 04.5 %   MCV 82.6  78.0 - 100.0 fL   MCH 29.1  26.0 - 34.0 pg   MCHC 35.3  30.0 - 36.0 g/dL   RDW 40.9  81.1 - 91.4 %   Platelets 305  150 - 400 K/uL   Neutrophils Relative % 72  43 - 77 %   Neutro Abs 10.3 (*) 1.7 - 7.7 K/uL   Lymphocytes Relative 21  12 - 46 %   Lymphs Abs 3.0  0.7 - 4.0 K/uL   Monocytes Relative 6  3 - 12 %   Monocytes Absolute 0.9  0.1 - 1.0 K/uL   Eosinophils Relative 1  0 - 5 %   Eosinophils Absolute 0.1  0.0 - 0.7 K/uL   Basophils Relative 0   0 - 1 %   Basophils Absolute 0.0  0.0 - 0.1 K/uL  COMPREHENSIVE METABOLIC PANEL      Result Value Range   Sodium 136  135 - 145 mEq/L   Potassium 4.2  3.5 - 5.1 mEq/L   Chloride 98  96 - 112 mEq/L   CO2 29  19 - 32 mEq/L   Glucose, Bld 147 (*) 70 - 99 mg/dL   BUN 10  6 - 23 mg/dL   Creatinine, Ser 7.82 (*) 0.50 - 1.10 mg/dL   Calcium 95.6  8.4 - 21.3 mg/dL   Total Protein 8.2  6.0 - 8.3 g/dL   Albumin 4.2  3.5 - 5.2 g/dL   AST 15  0 - 37 U/L   ALT 15  0 - 35 U/L   Alkaline Phosphatase 124 (*) 39 - 117 U/L   Total Bilirubin 0.8  0.3 - 1.2 mg/dL   GFR calc non Af Amer 50 (*) >90 mL/min   GFR calc Af Amer 57 (*) >90 mL/min  LIPASE, BLOOD      Result Value Range   Lipase 119 (*) 11 - 59 U/L  URINALYSIS, ROUTINE W REFLEX MICROSCOPIC      Result Value Range   Color, Urine YELLOW  YELLOW   APPearance CLEAR  CLEAR   Specific Gravity, Urine 1.021  1.005 - 1.030   pH 7.0  5.0 - 8.0   Glucose, UA NEGATIVE  NEGATIVE mg/dL   Hgb urine dipstick NEGATIVE  NEGATIVE   Bilirubin Urine SMALL (*) NEGATIVE   Ketones, ur 15 (*) NEGATIVE mg/dL   Protein, ur NEGATIVE  NEGATIVE mg/dL   Urobilinogen, UA 0.2  0.0 - 1.0 mg/dL   Nitrite NEGATIVE  NEGATIVE   Leukocytes, UA NEGATIVE  NEGATIVE  GLUCOSE, CAPILLARY      Result Value Range   Glucose-Capillary 124 (*) 70 - 99 mg/dL   Comment 1 Notify RN    CBC      Result Value Range   WBC 11.8 (*) 4.0 - 10.5 K/uL   RBC 4.71  3.87 - 5.11 MIL/uL   Hemoglobin 13.3  12.0 - 15.0 g/dL   HCT 08.6  57.8 - 46.9 %  MCV 82.8  78.0 - 100.0 fL   MCH 28.2  26.0 - 34.0 pg   MCHC 34.1  30.0 - 36.0 g/dL   RDW 16.1  09.6 - 04.5 %   Platelets 265  150 - 400 K/uL  CREATININE, SERUM      Result Value Range   Creatinine, Ser 1.05  0.50 - 1.10 mg/dL   GFR calc non Af Amer 55 (*) >90 mL/min   GFR calc Af Amer 64 (*) >90 mL/min  COMPREHENSIVE METABOLIC PANEL      Result Value Range   Sodium 138  135 - 145 mEq/L   Potassium 3.5  3.5 - 5.1 mEq/L   Chloride 102  96 -  112 mEq/L   CO2 24  19 - 32 mEq/L   Glucose, Bld 155 (*) 70 - 99 mg/dL   BUN 9  6 - 23 mg/dL   Creatinine, Ser 4.09  0.50 - 1.10 mg/dL   Calcium 9.2  8.4 - 81.1 mg/dL   Total Protein 7.1  6.0 - 8.3 g/dL   Albumin 3.4 (*) 3.5 - 5.2 g/dL   AST 13  0 - 37 U/L   ALT 13  0 - 35 U/L   Alkaline Phosphatase 105  39 - 117 U/L   Total Bilirubin 0.8  0.3 - 1.2 mg/dL   GFR calc non Af Amer 56 (*) >90 mL/min   GFR calc Af Amer 65 (*) >90 mL/min  CBC      Result Value Range   WBC 9.3  4.0 - 10.5 K/uL   RBC 4.69  3.87 - 5.11 MIL/uL   Hemoglobin 13.0  12.0 - 15.0 g/dL   HCT 91.4  78.2 - 95.6 %   MCV 84.0  78.0 - 100.0 fL   MCH 27.7  26.0 - 34.0 pg   MCHC 33.0  30.0 - 36.0 g/dL   RDW 21.3  08.6 - 57.8 %   Platelets 264  150 - 400 K/uL  Result Value Range   Troponin i, poc 0.00  0.00 - 0.08 ng/mL   Comment 3            US Abdomen Complete  02/28/2013   *RADIOLOGY REPORT*  Clinical Data:  Abdominal pain  ABDOMINAL ULTRASOUND COMPLETE  Comparison:  CT scan of the abdomen 02/27/2013.  Ultrasound abdomen from 2011.  Findings:  Gallbladder:  No gallstones, gallbladder wall thickening, or pericholecystic fluid.  Common Bile Duct:  Within normal limits in caliber.  Liver: No focal mass lesion identified.  Within normal limits in parenchymal echogenicity.  IVC:  Appears normal.  Pancreas:  No abnormality identified.  Spleen:  Within normal limits in size and echotexture.  Right kidney:  Normal in size and parenchymal echogenicity.  No evidence of mass or hydronephrosis. None appearing pelvic cysts.  Left kidney:  Normal in size and parenchymal echogenicity.  No evidence of mass or  hydronephrosis.  Abdominal Aorta:  No aneurysm identified.  IMPRESSION: No acute intra-abdominal pathology.   Original Report Authenticated By: Jolaine Click, M.D.   Ct Abdomen Pelvis W Contrast  02/27/2013   *RADIOLOGY REPORT*  Clinical Data: Unexplained abdominal pain.  Elevated serum lipase and alkaline phosphatase.  CT ABDOMEN AND PELVIS WITH CONTRAST  Technique:  Multidetector CT imaging of the abdomen and pelvis was performed following the standard protocol during bolus administration of intravenous contrast.  Contrast: OMNIPAQUE IOHEXOL 300 MG/ML IV. Oral contrast was also administered.  Comparison: No prior CT.  Abdominal ultrasound 06/19/2010.  Findings: Normal appearing liver with an anatomic variant in that the left lobe extends well across the midline into the left upper quadrant.  Minimal edema/inflammation adjacent to the head and uncinate of the pancreas.  Body and tail of the pancreas normal in appearance.  Normal appearing spleen, adrenal glands, and left kidney.  Approximate 3.4 x 4.4 cm parapelvic cyst involving the otherwise normal-appearing right kidney.  Gallbladder normal in appearance with a phrygian cap.  No biliary ductal dilation.  Normal-appearing stomach and small bowel.  Solitary diverticulum involving the proximal sigmoid colon without evidence of adjacent inflammation.  Remainder of the colon normal in appearance, containing liquid stool.  No ascites.  Appendix not visualized but no pericecal inflammation.  Numerous uterine fibroids, some which are calcified and degenerated.  Bilateral tubal ligation clips.  No adnexal masses or free pelvic fluid.  Phleboliths low in both sides of the low pelvis.  Urinary bladder decompressed and unremarkable.  Bone window images demonstrate degenerative changes involving the lower thoracic and lumbar spine and the symphysis pubis. Visualized lung bases clear.  Heart size upper normal to slightly enlarged with aortic annular calcification.   IMPRESSION:  1.  Minimal focal pancreatitis involving the head and uncinate process of the pancreas. 2.  Uterine fibroids, some of which are calcified and degenerated. 3.  Solitary proximal sigmoid colon diverticulum without evidence of acute diverticulitis.   Original Report Authenticated By: Hulan Saas, M.D.      1. Pancreatitis   2. Fibroid   3. Diverticula of colon   4. Acute pancreatitis   5. Hyperlipidemia   6. Type 2 diabetes mellitus       MDM          Nelia Shi, MD 03/07/13 2034

## 2013-02-27 NOTE — ED Notes (Signed)
Pt is here with 4 day history of fluctuating abdominal pain that radiates to upper abdomen.  No diarrhea, did take laxative, no change.  Pt is reporting tooth pain

## 2013-02-28 ENCOUNTER — Observation Stay (HOSPITAL_COMMUNITY): Payer: BC Managed Care – PPO

## 2013-02-28 ENCOUNTER — Encounter (HOSPITAL_COMMUNITY): Payer: Self-pay | Admitting: *Deleted

## 2013-02-28 DIAGNOSIS — K859 Acute pancreatitis without necrosis or infection, unspecified: Principal | ICD-10-CM

## 2013-02-28 DIAGNOSIS — E119 Type 2 diabetes mellitus without complications: Secondary | ICD-10-CM

## 2013-02-28 DIAGNOSIS — E785 Hyperlipidemia, unspecified: Secondary | ICD-10-CM

## 2013-02-28 LAB — GLUCOSE, CAPILLARY
Glucose-Capillary: 113 mg/dL — ABNORMAL HIGH (ref 70–99)
Glucose-Capillary: 127 mg/dL — ABNORMAL HIGH (ref 70–99)

## 2013-02-28 LAB — COMPREHENSIVE METABOLIC PANEL
ALT: 13 U/L (ref 0–35)
AST: 13 U/L (ref 0–37)
Alkaline Phosphatase: 105 U/L (ref 39–117)
CO2: 24 mEq/L (ref 19–32)
Chloride: 102 mEq/L (ref 96–112)
GFR calc Af Amer: 65 mL/min — ABNORMAL LOW (ref >90)
GFR calc non Af Amer: 56 mL/min — ABNORMAL LOW (ref >90)
Glucose, Bld: 155 mg/dL — ABNORMAL HIGH (ref 70–99)
Potassium: 3.5 mEq/L (ref 3.5–5.1)
Sodium: 138 mEq/L (ref 135–145)
Total Bilirubin: 0.8 mg/dL (ref 0.3–1.2)

## 2013-02-28 LAB — CBC
HCT: 39 % (ref 36.0–46.0)
Hemoglobin: 13 g/dL (ref 12.0–15.0)
MCH: 28.2 pg (ref 26.0–34.0)
MCH: 27.7 pg (ref 26.0–34.0)
MCHC: 33 g/dL (ref 30.0–36.0)
MCV: 82.8 fL (ref 78.0–100.0)
Platelets: 264 10*3/uL (ref 150–400)
RBC: 4.71 MIL/uL (ref 3.87–5.11)
RDW: 13.4 % (ref 11.5–15.5)
WBC: 11.8 10*3/uL — ABNORMAL HIGH (ref 4.0–10.5)

## 2013-02-28 LAB — CREATININE, SERUM: GFR calc Af Amer: 64 mL/min — ABNORMAL LOW (ref >90)

## 2013-02-28 LAB — TRIGLYCERIDES: Triglycerides: 95 mg/dL (ref <150)

## 2013-02-28 MED ORDER — INSULIN ASPART 100 UNIT/ML ~~LOC~~ SOLN
0.0000 [IU] | SUBCUTANEOUS | Status: DC
  Administered 2013-02-28: 3 [IU] via SUBCUTANEOUS
  Administered 2013-02-28: 2 [IU] via SUBCUTANEOUS

## 2013-02-28 MED ORDER — DEXTROSE-NACL 5-0.45 % IV SOLN
INTRAVENOUS | Status: AC
  Administered 2013-02-28: via INTRAVENOUS

## 2013-02-28 MED ORDER — INSULIN ASPART 100 UNIT/ML ~~LOC~~ SOLN: 0.0000 [IU] | Freq: Three times a day (TID) | SUBCUTANEOUS | Status: DC

## 2013-02-28 MED ORDER — ENOXAPARIN SODIUM 40 MG/0.4ML ~~LOC~~ SOLN
40.0000 mg | SUBCUTANEOUS | Status: DC
  Filled 2013-02-28 (×2): qty 0.4

## 2013-02-28 MED ORDER — PANTOPRAZOLE SODIUM 40 MG IV SOLR
40.0000 mg | Freq: Every day | INTRAVENOUS | Status: DC
  Administered 2013-02-28 (×2): 40 mg via INTRAVENOUS
  Filled 2013-02-28 (×3): qty 40

## 2013-02-28 NOTE — Progress Notes (Signed)
PATIENT DETAILS Name: Lori Shah Age: 62 y.o. Sex: female Date of Birth: 05/11/1951 Admit Date: 02/27/2013 Admitting Physician Jonah Blue, DO FAO:[id number],QIONGE Thomasena Edis, MD  Subjective: Better-abdominal pain significantly reduced  Assessment/Plan: Principal Problem:   Acute pancreatitis -?etiology-No gall stones seen on CT Abd or in USG,no hx of ETOH use, TG's normal, do not see any medications that are the culprits -since better with only mild epigastric tenderness-will start clears -will need outpatient GI eval  Active Problems:   Type 2 diabetes mellitus -CBG's stable -on SSI, hold Metformin while inpatient    Hyperlipidemia -resume statins on discharge  Disposition: Remain inpatient  DVT Prophylaxis: Prophylactic Lovenox   Code Status: Full code   Family Communication None at bedside  Procedures:  None  CONSULTS:  None   MEDICATIONS: Scheduled Meds: . enoxaparin (LOVENOX) injection  40 mg Subcutaneous Q24H  . insulin aspart  0-9 Units Subcutaneous Q8H  . pantoprazole (PROTONIX) IV  40 mg Intravenous QHS   Continuous Infusions: . dextrose 5 % and 0.45% NaCl 125 mL/hr at 02/28/13 0028   PRN Meds:.morphine injection  Antibiotics: Anti-infectives   None       PHYSICAL EXAM: Vital signs in last 24 hours: Filed Vitals:   02/27/13 2245 02/27/13 2300 02/27/13 2350 02/28/13 0424  BP: 110/65 137/72 118/69 112/70  Pulse: 68 68 61 64  Temp:   98.2 F (36.8 C) 98.4 F (36.9 C)  TempSrc:   Oral Oral  Resp:   17 18  Height:   5\' 1"  (1.549 m)   Weight:   80.786 kg (178 lb 1.6 oz)   SpO2: 100% 98% 97% 94%    Weight change:  Filed Weights   02/27/13 2350  Weight: 80.786 kg (178 lb 1.6 oz)   Body mass index is 33.67 kg/(m^2).   Gen Exam: Awake and alert with clear speech.   Neck: Supple, No JVD.   Chest: B/L Clear.   CVS: S1 S2 Regular, no murmurs.  Abdomen: soft, BS +, non tender, non distended.  Extremities: no edema, lower  extremities warm to touch. Neurologic: Non Focal.   Skin: No Rash.   Wounds: N/A.    Intake/Output from previous day: No intake or output data in the 24 hours ending 02/28/13 0841   LAB RESULTS: CBC  Recent Labs Lab 02/27/13 1637 02/28/13 0030 02/28/13 0530  WBC 14.3* 11.8* 9.3  HGB 14.7 13.3 13.0  HCT 41.7 39.0 39.4  PLT 305 265 264  MCV 82.6 82.8 84.0  MCH 29.1 28.2 27.7  MCHC 35.3 34.1 33.0  RDW 13.2 13.2 13.4  LYMPHSABS 3.0  --   --   MONOABS 0.9  --   --   EOSABS 0.1  --   --   BASOSABS 0.0  --   --     Chemistries   Recent Labs Lab 02/27/13 1637 02/28/13 0030 02/28/13 0530  NA 136  --  138  K 4.2  --  3.5  CL 98  --  102  CO2 29  --  24  GLUCOSE 147*  --  155*  BUN 10  --  9  CREATININE 1.15* 1.05 1.04  CALCIUM 10.3  --  9.2    CBG:  Recent Labs Lab 02/28/13 0005 02/28/13 0356 02/28/13 0801  GLUCAP 124* 158* 113*    GFR Estimated Creatinine Clearance: 53.3 ml/min (by C-G formula based on Cr of 1.04).  Coagulation profile No results found for this basename: INR, PROTIME,  in the last  168 hours  Cardiac Enzymes  Recent Labs Lab 02/28/13 0012 02/28/13 0530  TROPONINI <0.30 <0.30    No components found with this basename: POCBNP,  No results found for this basename: DDIMER,  in the last 72 hours No results found for this basename: HGBA1C,  in the last 72 hours  Recent Labs  02/28/13 0030  TRIG 95   No results found for this basename: TSH, T4TOTAL, FREET3, T3FREE, THYROIDAB,  in the last 72 hours No results found for this basename: VITAMINB12, FOLATE, FERRITIN, TIBC, IRON, RETICCTPCT,  in the last 72 hours  Recent Labs  02/27/13 1637  LIPASE 119*    Urine Studies No results found for this basename: UACOL, UAPR, USPG, UPH, UTP, UGL, UKET, UBIL, UHGB, UNIT, UROB, ULEU, UEPI, UWBC, URBC, UBAC, CAST, CRYS, UCOM, BILUA,  in the last 72 hours  MICROBIOLOGY: No results found for this or any previous visit (from the past 240  hour(s)).  RADIOLOGY STUDIES/RESULTS: US Abdomen Complete  02/28/2013   *RADIOLOGY REPORT*  Clinical Data:  Abdominal pain  ABDOMINAL ULTRASOUND COMPLETE  Comparison:  CT scan of the abdomen 02/27/2013.  Ultrasound abdomen from 2011.  Findings:  Gallbladder:  No gallstones, gallbladder wall thickening, or pericholecystic fluid.  Common Bile Duct:  Within normal limits in caliber.  Liver: No focal mass lesion identified.  Within normal limits in parenchymal echogenicity.  IVC:  Appears normal.  Pancreas:  No abnormality identified.  Spleen:  Within normal limits in size and echotexture.  Right kidney:  Normal in size and parenchymal echogenicity.  No evidence of mass or hydronephrosis. None appearing pelvic cysts.  Left kidney:  Normal in size and parenchymal echogenicity.  No evidence of mass or hydronephrosis.  Abdominal Aorta:  No aneurysm identified.  IMPRESSION: No acute intra-abdominal pathology.   Original Report Authenticated By: Jolaine Click, M.D.   Ct Abdomen Pelvis W Contrast  02/27/2013   *RADIOLOGY REPORT*  Clinical Data: Unexplained abdominal pain.  Elevated serum lipase and alkaline phosphatase.  CT ABDOMEN AND PELVIS WITH CONTRAST  Technique:  Multidetector CT imaging of the abdomen and pelvis was performed following the standard protocol during bolus administration of intravenous contrast.  Contrast: OMNIPAQUE IOHEXOL 300 MG/ML IV. Oral contrast was also administered.  Comparison: No prior CT.  Abdominal ultrasound 06/19/2010.  Findings: Normal appearing liver with an anatomic variant in that the left lobe extends well across the midline into the left upper quadrant.  Minimal edema/inflammation adjacent to the head and uncinate of the pancreas.  Body and tail of the pancreas normal in appearance.  Normal appearing spleen, adrenal glands, and left kidney.  Approximate 3.4 x 4.4 cm parapelvic cyst involving the otherwise normal-appearing right kidney.  Gallbladder normal in appearance with  a phrygian cap.  No biliary ductal dilation.  Normal-appearing stomach and small bowel.  Solitary diverticulum involving the proximal sigmoid colon without evidence of adjacent inflammation.  Remainder of the colon normal in appearance, containing liquid stool.  No ascites.  Appendix not visualized but no pericecal inflammation.  Numerous uterine fibroids, some which are calcified and degenerated.  Bilateral tubal ligation clips.  No adnexal masses or free pelvic fluid.  Phleboliths low in both sides of the low pelvis.  Urinary bladder decompressed and unremarkable.  Bone window images demonstrate degenerative changes involving the lower thoracic and lumbar spine and the symphysis pubis. Visualized lung bases clear.  Heart size upper normal to slightly enlarged with aortic annular calcification.  IMPRESSION:  1.  Minimal focal  pancreatitis involving the head and uncinate process of the pancreas. 2.  Uterine fibroids, some of which are calcified and degenerated. 3.  Solitary proximal sigmoid colon diverticulum without evidence of acute diverticulitis.   Original Report Authenticated By: Hulan Saas, M.D.    Jeoffrey Massed, MD  [facility] Pager:[phone]  If 7PM-7AM, please contact night-coverage [email or web address] Password [facility] 02/28/2013, 8:41 AM   LOS: 1 day

## 2013-02-28 NOTE — Progress Notes (Signed)
Utilization review completed.  P.J. Kaien Pezzullo,RN,BSN Case Manager [phone]  

## 2013-02-28 NOTE — Progress Notes (Signed)
NICHOL ATOR [id number] Admitted to 5509: 02/28/2013 12:21 AM Attending Provider: Jonah Blue, DO    Lori Shah is a 62 y.o. female patient admitted from ED awake, alert  & orientated  X 3,  Full Code, VSS - Blood pressure 137/72, pulse 68, temperature 98.1 F (36.7 C), resp. rate 18, SpO2 98.00%.RA, no c/o shortness of breath, no c/o chest pain, no distress noted.    IV site WDL:  with a transparent dsg that's clean dry and intact.  Allergies:   Allergies  Allergen Reactions  . Other Other (See Comments)    Allergic to [place] nuts, pecans, etc - causes ear swelling  . Penicillins Hives and Swelling     Past Medical History  Diagnosis Date  . Diabetes mellitus   . Hyperlipidemia   . GERD (gastroesophageal reflux disease)     History:  obtained from patient  Pt orientation to unit, room and routine. Information packet given to patient and safety video refused.  Admission INP armband ID verified with patient, and in place. SR up x 2, fall risk assessment complete with Patient verbalizing understanding of risks associated with falls. Pt verbalizes an understanding of how to use the call bell and to call for help before getting out of bed.  Skin, clean-dry- intact without evidence of bruising, or skin tears.   No evidence of skin break down noted on exam.  Will cont to monitor and assist as needed.  Elisha Ponder, RN 02/28/2013 12:21 AM

## 2013-02-28 NOTE — ED Provider Notes (Signed)
Medical screening examination/treatment/procedure(s) were performed by non-physician practitioner and as supervising physician I was immediately available for consultation/collaboration.   Hurman Horn, MD 02/28/13 [phone]

## 2013-03-01 LAB — CBC
HCT: 40.2 % (ref 36.0–46.0)
Hemoglobin: 13.7 g/dL (ref 12.0–15.0)
MCH: 28.6 pg (ref 26.0–34.0)
MCHC: 34.1 g/dL (ref 30.0–36.0)

## 2013-03-01 LAB — COMPREHENSIVE METABOLIC PANEL
ALT: 11 U/L (ref 0–35)
Albumin: 3.5 g/dL (ref 3.5–5.2)
Alkaline Phosphatase: 101 U/L (ref 39–117)
Chloride: 103 mEq/L (ref 96–112)
Glucose, Bld: 133 mg/dL — ABNORMAL HIGH (ref 70–99)
Potassium: 4.1 mEq/L (ref 3.5–5.1)
Sodium: 138 mEq/L (ref 135–145)
Total Bilirubin: 0.7 mg/dL (ref 0.3–1.2)
Total Protein: 7.3 g/dL (ref 6.0–8.3)

## 2013-03-01 MED ORDER — METFORMIN HCL 1000 MG PO TABS: 1000.0000 mg | ORAL_TABLET | Freq: Two times a day (BID) | ORAL | Status: DC

## 2013-03-01 NOTE — Care Management Note (Signed)
    Page 1 of 1   03/01/2013     1:39:22 PM   CARE MANAGEMENT NOTE 03/01/2013  Patient:  Lori Shah, Lori Shah   Account Number:  [id number]  Date Initiated:  03/01/2013  Documentation initiated by:  Letha Cape  Subjective/Objective Assessment:   dx acute pancreatitis  admit- lives with spouse, pta indep.     Action/Plan:   Anticipated DC Date:  03/01/2013   Anticipated DC Plan:  HOME/SELF CARE      DC Planning Services  CM consult      Choice offered to / List presented to:             Status of service:  Completed, signed off Medicare Important Message given?   (If response is "NO", the following Medicare IM given date fields will be blank) Date Medicare IM given:   Date Additional Medicare IM given:    Discharge Disposition:  HOME/SELF CARE  Per UR Regulation:  Reviewed for med. necessity/level of care/duration of stay  If discussed at Long Length of Stay Meetings, dates discussed:    Comments:  03/01/13 13:38 Letha Cape RN, BSN [phone] patient lives with spouse, pta indep.  No needs anticipated.

## 2013-03-01 NOTE — Discharge Summary (Signed)
Physician Discharge Summary  Lori Shah:[id number] DOB: [birth year] DOA: 02/27/2013  PCP: Astrid Divine, MD  Admit date: 02/27/2013 Discharge date: 03/01/2013  Time spent: 35 minutes  Recommendations for Outpatient Follow-up:  1. PCP hospital follow.    2. GI follow up for pancreatitis of unknown etiology.  Discharge Diagnoses:  Principal Problem:   Acute pancreatitis Active Problems:   Type 2 diabetes mellitus   Hyperlipidemia   Discharge Condition: stable. Able to tolerate a solid diet.  Diet recommendation: low fat, soft diet  Filed Weights   02/27/13 2350  Weight: 80.786 kg (178 lb 1.6 oz)    History of present illness:   61 yr. Old AAF w/ hx Type 2 NIDDM, HL, GERD, presents with abdominal pain. She states that approximately 4 days ago she began to notice a crampy epigastric pain that would come and go. She states she quickly noticed that this worsened with eating. She denies any vomiting, hematemesis, melena, or hematochezia. She admits to nausea and decreased appetite. She states she tried to take a laxative to make this better, but it persisted. She denies any recent changes to her medications, denies any alcohol use as well. She denies any previous occurrences. She denies any diarrhea, but admits to some constipation, with a recent BM yesterday.  She was noted to have a lipase of 119 in the ED with notable epigastric pain. Her WBC was noted to be 14k. Her AST/ALT were normal, but a slightly elevated ALP at 124. A CT of her abdomen was performed which showed evidence of minimal focal pancreatitis involving the head and uncinate process. She had evidence of proximal sigmoid diverticuli without diverticulitis. She was treated with IV narcotic therapy and IVF. She feels slightly better.  EKG was reviewed and showed some T wave inversions in the inferiorlateral leads, no previous for comparison. She denies any CP at rest or with exertion. Denies DOE. Denies PND/orthopnea.  An Istat trop I was 0.00.   Hospital Course:   Acute pancreatitis  -?etiology-No gall stones seen on CT Abd or in USG,no hx of ETOH use, TG's normal, do not see any medications that are the culprits  -patient improved with pain meds and NPO supportive care.  Diet slowly advanced to non-fat soft. Vitamins A. soft with very minimal tenderness in the epigastric area. -recommend outpatient GI eval for further evaluation once the pancreatic swelling has resolved.  Type 2 diabetes mellitus  -CBG's stable (130 - 150) -on SSI, hold Metformin while inpatient  -resume oral medications on 03/02/13  Hyperlipidemia  -resume statins on discharge     Discharge Exam: Filed Vitals:   02/28/13 0424 02/28/13 1355 02/28/13 2054 03/01/13 0536  BP: 112/70 124/61 128/79 127/74  Pulse: 64 67 68 58  Temp: 98.4 F (36.9 C) 98.1 F (36.7 C) 98.8 F (37.1 C) 98.3 F (36.8 C)  TempSrc: Oral Oral Oral Oral  Resp: 18 18 16 16   Height:      Weight:      SpO2: 94% 96% 95% 100%    Gen Exam: Awake and alert with clear speech. Very pleasant.  Talking on the phone. Neck: Supple, No JVD.  Chest: B/L Clear. Painful to inspire deeply. CVS: S1 S2 Regular, no murmurs.  Abdomen: soft, BS +, non tender, non distended.  Extremities: no edema, lower extremities warm to touch.  Neurologic: Non Focal.     Discharge Instructions      Discharge Orders   Future Orders Complete By Expires  Diet - low sodium heart healthy  As directed     Increase activity slowly  As directed         Medication List    TAKE these medications       atorvastatin 20 MG tablet  Commonly known as:  LIPITOR  Take 20 mg by mouth daily.     glimepiride 4 MG tablet  Commonly known as:  AMARYL  Take 4 mg by mouth daily before breakfast.     metFORMIN 1000 MG tablet  Commonly known as:  GLUCOPHAGE  Take 1 tablet (1,000 mg total) by mouth 2 (two) times daily with a meal. RESUME 1 tablet two times a day on 03/02/13  Start  taking on:  03/02/2013     omeprazole 20 MG capsule  Commonly known as:  PRILOSEC  Take 20 mg by mouth daily.       Allergies  Allergen Reactions  . Other Other (See Comments)    Allergic to [place] nuts, pecans, etc - causes ear swelling  . Penicillins Hives and Swelling   Follow-up Information   Follow up with Astrid Divine, MD. Schedule an appointment as soon as possible for a visit in 1 week.   Contact information:   [address], [address] [place] [address] [phone]        The results of significant diagnostics from this hospitalization (including imaging, microbiology, ancillary and laboratory) are listed below for reference.    Significant Diagnostic Studies:   Results for SHAKETHA, JEON (MRN [id number]) as of 03/01/2013 09:10  Ref. Range 02/28/2013 00:30 02/28/2013 03:56 02/28/2013 05:30  Troponin I Latest Range: <0.30 ng/mL   <0.30  Triglycerides Latest Range: <150 mg/dL 95          US Abdomen Complete  02/28/2013   *RADIOLOGY REPORT*  Clinical Data:  Abdominal pain  ABDOMINAL ULTRASOUND COMPLETE  Comparison:  CT scan of the abdomen 02/27/2013.  Ultrasound abdomen from 2011.  Findings:  Gallbladder:  No gallstones, gallbladder wall thickening, or pericholecystic fluid.  Common Bile Duct:  Within normal limits in caliber.  Liver: No focal mass lesion identified.  Within normal limits in parenchymal echogenicity.  IVC:  Appears normal.  Pancreas:  No abnormality identified.  Spleen:  Within normal limits in size and echotexture.  Right kidney:  Normal in size and parenchymal echogenicity.  No evidence of mass or hydronephrosis. None appearing pelvic cysts.  Left kidney:  Normal in size and parenchymal echogenicity.  No evidence of mass or hydronephrosis.  Abdominal Aorta:  No aneurysm identified.  IMPRESSION: No acute intra-abdominal pathology.   Original Report Authenticated By: Jolaine Click, M.D.   Ct Abdomen Pelvis W Contrast  02/27/2013   *RADIOLOGY  REPORT*  Clinical Data: Unexplained abdominal pain.  Elevated serum lipase and alkaline phosphatase.  CT ABDOMEN AND PELVIS WITH CONTRAST  Technique:  Multidetector CT imaging of the abdomen and pelvis was performed following the standard protocol during bolus administration of intravenous contrast.  Contrast: OMNIPAQUE IOHEXOL 300 MG/ML IV. Oral contrast was also administered.  Comparison: No prior CT.  Abdominal ultrasound 06/19/2010.  Findings: Normal appearing liver with an anatomic variant in that the left lobe extends well across the midline into the left upper quadrant.  Minimal edema/inflammation adjacent to the head and uncinate of the pancreas.  Body and tail of the pancreas normal in appearance.  Normal appearing spleen, adrenal glands, and left kidney.  Approximate 3.4 x 4.4 cm parapelvic cyst involving the otherwise  normal-appearing right kidney.  Gallbladder normal in appearance with a phrygian cap.  No biliary ductal dilation.  Normal-appearing stomach and small bowel.  Solitary diverticulum involving the proximal sigmoid colon without evidence of adjacent inflammation.  Remainder of the colon normal in appearance, containing liquid stool.  No ascites.  Appendix not visualized but no pericecal inflammation.  Numerous uterine fibroids, some which are calcified and degenerated.  Bilateral tubal ligation clips.  No adnexal masses or free pelvic fluid.  Phleboliths low in both sides of the low pelvis.  Urinary bladder decompressed and unremarkable.  Bone window images demonstrate degenerative changes involving the lower thoracic and lumbar spine and the symphysis pubis. Visualized lung bases clear.  Heart size upper normal to slightly enlarged with aortic annular calcification.  IMPRESSION:  1.  Minimal focal pancreatitis involving the head and uncinate process of the pancreas. 2.  Uterine fibroids, some of which are calcified and degenerated. 3.  Solitary proximal sigmoid colon diverticulum without  evidence of acute diverticulitis.   Original Report Authenticated By: Hulan Saas, M.D.      Labs: Basic Metabolic Panel:  Recent Labs Lab 02/27/13 1637 02/28/13 0030 02/28/13 0530 03/01/13 0425  NA 136  --  138 138  K 4.2  --  3.5 4.1  CL 98  --  102 103  CO2 29  --  24 24  GLUCOSE 147*  --  155* 133*  BUN 10  --  9 8  CREATININE 1.15* 1.05 1.04 1.05  CALCIUM 10.3  --  9.2 9.6   Liver Function Tests:  Recent Labs Lab 02/27/13 1637 02/28/13 0530 03/01/13 0425  AST 15 13 14   ALT 15 13 11   ALKPHOS 124* 105 101  BILITOT 0.8 0.8 0.7  PROT 8.2 7.1 7.3  ALBUMIN 4.2 3.4* 3.5    Recent Labs Lab 02/27/13 1637 03/01/13 0425  LIPASE 119* 52   CBC:  Recent Labs Lab 02/27/13 1637 02/28/13 0030 02/28/13 0530 03/01/13 0425  WBC 14.3* 11.8* 9.3 8.6  NEUTROABS 10.3*  --   --   --   HGB 14.7 13.3 13.0 13.7  HCT 41.7 39.0 39.4 40.2  MCV 82.6 82.8 84.0 83.9  PLT 305 265 264 274   Cardiac Enzymes:  Recent Labs Lab 02/28/13 0012 02/28/13 0530  TROPONINI <0.30 <0.30   CBG:  Recent Labs Lab 02/28/13 0356 02/28/13 0801 02/28/13 1643 02/28/13 2148 03/01/13 0615  GLUCAP 158* 113* 127* 108* 130*    Signed:  Stephani Police, PA-C [facility] 03/01/2013, 9:36 AM   Attending - Patient seen and examined, as tolerated to a full liquid diet so far, abdomen is very soft and she is anxious to go home. She has had headaches and [place] toast for breakfast without any difficulty, she will be discharged home today. She will followup with GI as an outpatient  Windell Norfolk MD

## 2013-04-01 ENCOUNTER — Encounter (HOSPITAL_COMMUNITY): Payer: Self-pay | Admitting: Pharmacy Technician

## 2013-04-05 ENCOUNTER — Encounter (HOSPITAL_COMMUNITY): Payer: Self-pay | Admitting: *Deleted

## 2013-04-05 NOTE — Progress Notes (Signed)
ekg 03-02-13 epic

## 2013-04-19 ENCOUNTER — Other Ambulatory Visit: Payer: Self-pay | Admitting: Gastroenterology

## 2013-04-19 NOTE — Addendum Note (Signed)
Addended by: Willis Modena on: 04/19/2013 04:37 PM   Modules accepted: Orders

## 2013-04-20 ENCOUNTER — Ambulatory Visit (HOSPITAL_COMMUNITY): Payer: BC Managed Care – PPO | Admitting: Registered Nurse

## 2013-04-20 ENCOUNTER — Encounter (HOSPITAL_COMMUNITY): Payer: Self-pay | Admitting: *Deleted

## 2013-04-20 ENCOUNTER — Ambulatory Visit (HOSPITAL_COMMUNITY)
Admission: RE | Admit: 2013-04-20 | Discharge: 2013-04-20 | Disposition: A | Discharge status: Home / Self Care | Payer: BC Managed Care – PPO | Source: Ambulatory Visit | Attending: Gastroenterology | Admitting: Gastroenterology

## 2013-04-20 ENCOUNTER — Encounter (HOSPITAL_COMMUNITY): Payer: Self-pay | Admitting: Registered Nurse

## 2013-04-20 ENCOUNTER — Encounter (HOSPITAL_COMMUNITY)
Admission: RE | Disposition: A | Discharge status: Home / Self Care | Payer: Self-pay | Source: Ambulatory Visit | Attending: Gastroenterology

## 2013-04-20 DIAGNOSIS — N281 Cyst of kidney, acquired: Secondary | ICD-10-CM | POA: Insufficient documentation

## 2013-04-20 DIAGNOSIS — K859 Acute pancreatitis without necrosis or infection, unspecified: Secondary | ICD-10-CM | POA: Insufficient documentation

## 2013-04-20 DIAGNOSIS — F17200 Nicotine dependence, unspecified, uncomplicated: Secondary | ICD-10-CM | POA: Insufficient documentation

## 2013-04-20 DIAGNOSIS — K219 Gastro-esophageal reflux disease without esophagitis: Secondary | ICD-10-CM | POA: Insufficient documentation

## 2013-04-20 DIAGNOSIS — E119 Type 2 diabetes mellitus without complications: Secondary | ICD-10-CM | POA: Insufficient documentation

## 2013-04-20 HISTORY — PX: EUS: SHX5427

## 2013-04-20 HISTORY — DX: Unspecified osteoarthritis, unspecified site: M19.90

## 2013-04-20 LAB — GLUCOSE, CAPILLARY: Glucose-Capillary: 123 mg/dL — ABNORMAL HIGH (ref 70–99)

## 2013-04-20 SURGERY — ESOPHAGEAL ENDOSCOPIC ULTRASOUND (EUS) RADIAL
Anesthesia: Monitor Anesthesia Care

## 2013-04-20 MED ORDER — KETAMINE HCL 10 MG/ML IJ SOLN
INTRAMUSCULAR | Status: DC | PRN
  Administered 2013-04-20: 10 mg via INTRAVENOUS

## 2013-04-20 MED ORDER — LACTATED RINGERS IV SOLN
INTRAVENOUS | Status: DC
  Administered 2013-04-20: 1000 mL via INTRAVENOUS

## 2013-04-20 MED ORDER — LIDOCAINE HCL (CARDIAC) 20 MG/ML IV SOLN
INTRAVENOUS | Status: DC | PRN
  Administered 2013-04-20: 100 mg via INTRAVENOUS

## 2013-04-20 MED ORDER — MIDAZOLAM HCL 5 MG/5ML IJ SOLN
INTRAMUSCULAR | Status: DC | PRN
  Administered 2013-04-20: 2 mg via INTRAVENOUS

## 2013-04-20 MED ORDER — SODIUM CHLORIDE 0.9 % IV SOLN: INTRAVENOUS | Status: DC

## 2013-04-20 MED ORDER — PROMETHAZINE HCL 25 MG/ML IJ SOLN: 6.2500 mg | INTRAMUSCULAR | Status: DC | PRN

## 2013-04-20 MED ORDER — PROPOFOL INFUSION 10 MG/ML OPTIME
INTRAVENOUS | Status: DC | PRN
  Administered 2013-04-20: 100 ug/kg/min via INTRAVENOUS

## 2013-04-20 MED ORDER — BUTAMBEN-TETRACAINE-BENZOCAINE 2-2-14 % EX AERO
INHALATION_SPRAY | CUTANEOUS | Status: DC | PRN
  Administered 2013-04-20: 1 via TOPICAL

## 2013-04-20 MED ORDER — FENTANYL CITRATE 0.05 MG/ML IJ SOLN
INTRAMUSCULAR | Status: DC | PRN
  Administered 2013-04-20 (×2): 25 ug via INTRAVENOUS

## 2013-04-20 NOTE — H&P (Signed)
Patient interval history reviewed.  Patient examined again.  There has been no change from documented H/P dated 03/24/13 (scanned into chart from our office) except as documented above.  Assessment:  1. Idiopathic pancreatitis.  Plan:  1.  Endoscopic ultrasound with possible biopsies (FNA). 2.  Risks (bleeding, infection, bowel perforation that could require surgery, sedation-related changes in cardiopulmonary systems), benefits (identification and possible treatment of source of symptoms, exclusion of certain causes of symptoms), and alternatives (watchful waiting, radiographic imaging studies, empiric medical treatment) of upper endoscopy with ultrasound and possible biopsies (EUS +/- FNA) were explained to patient in detail and patient wishes to proceed.

## 2013-04-20 NOTE — Anesthesia Preprocedure Evaluation (Addendum)
Anesthesia Evaluation  Patient identified by MRN, date of birth, ID band Patient awake    Reviewed: Allergy & Precautions, H&P , NPO status , Patient's Chart, lab work & pertinent test results  Airway Mallampati: II TM Distance: >3 FB Neck ROM: Full    Dental no notable dental hx.    Pulmonary Current Smoker,  breath sounds clear to auscultation  Pulmonary exam normal       Cardiovascular negative cardio ROS  Rhythm:Regular Rate:Normal     Neuro/Psych negative neurological ROS  negative psych ROS   GI/Hepatic Neg liver ROS, GERD-  Medicated,  Endo/Other  negative endocrine ROSdiabetes, Oral Hypoglycemic Agents  Renal/GU negative Renal ROS  negative genitourinary   Musculoskeletal negative musculoskeletal ROS (+)   Abdominal   Peds negative pediatric ROS (+)  Hematology negative hematology ROS (+)   Anesthesia Other Findings   Reproductive/Obstetrics negative OB ROS                         Anesthesia Physical Anesthesia Plan  ASA: II  Anesthesia Plan: MAC   Post-op Pain Management:    Induction: Intravenous  Airway Management Planned: Nasal Cannula  Additional Equipment:   Intra-op Plan:   Post-operative Plan:   Informed Consent: I have reviewed the patients History and Physical, chart, labs and discussed the procedure including the risks, benefits and alternatives for the proposed anesthesia with the patient or authorized representative who has indicated his/her understanding and acceptance.     Plan Discussed with: CRNA and Surgeon  Anesthesia Plan Comments:         Anesthesia Quick Evaluation

## 2013-04-20 NOTE — Anesthesia Postprocedure Evaluation (Signed)
  Anesthesia Post-op Note  Patient: Lori Shah  Procedure(s) Performed: Procedure(s) (LRB): ESOPHAGEAL ENDOSCOPIC ULTRASOUND (EUS) RADIAL (N/A)  Patient Location: PACU  Anesthesia Type: MAC  Level of Consciousness: awake and alert   Airway and Oxygen Therapy: Patient Spontanous Breathing  Post-op Pain: mild  Post-op Assessment: Post-op Vital signs reviewed, Patient's Cardiovascular Status Stable, Respiratory Function Stable, Patent Airway and No signs of Nausea or vomiting  Last Vitals:  Filed Vitals:   04/20/13 0926  BP: 127/75  Temp:   Resp: 18    Post-op Vital Signs: stable   Complications: No apparent anesthesia complications

## 2013-04-20 NOTE — Op Note (Signed)
[facility] [address] [place], [address]   ENDOSCOPIC ULTRASOUND PROCEDURE REPORT  PATIENT: Lori Shah, Lori Shah  MR#: [id number] BIRTHDATE: May 03, 1951  GENDER: Female ENDOSCOPIST: Willis Modena, MD REFERRED BY:  Maurice Small, M.D. PROCEDURE DATE:  04/20/2013 PROCEDURE:   Upper EUS ASA CLASS:      Class II INDICATIONS:   1.  idiopathic pancreatitis. MEDICATIONS: MAC sedation, administered by CRNA and Cetacaine spray x 2  DESCRIPTION OF PROCEDURE:   After the risks benefits and alternatives of the procedure were  explained, informed consent was obtained. The patient was then placed in the left, lateral, decubitus postion and IV sedation was administered. Throughout the procedure, the patients blood pressure, pulse and oxygen saturations were monitored continuously.  Under direct visualization, the EUS scope  endoscope was introduced through the mouth  and advanced to the second portion of the duodenum .  Water was used as necessary to provide an acoustic interface.  Upon completion of the imaging, water was removed and the patient was sent to the recovery room in satisfactory condition.    FINDINGS:      Few triangular benign-appearing peripancreatic lymph nodes, predominantly around the head and uncinate pancreas, highly likely reactive in nature.  Ampulla normal via EUS.  CBD non-dilated, and without evidence of wall thickening of choledocholithiasis.  Gallbladder normal without evidence of wall thickening, stones, or sludge.  Head, uncinate, genu, body and tail of pancreas appeared normal, without evidence of mass or findings of acute or chronic pancreatitis.  Incidental notation of right renal cyst, previously seen on other imaging studies.  IMPRESSION:     As above.  No explanation for patient's recent pancreatitis.  RECOMMENDATIONS:     1.  Watch for potential complications of procedure. 2.  Continue gradual escalation of diet, but would stay on  low fat diet for the next 3-4 weeks. 3.  Monitor otherwise expectantly at this time.  However, when/if patient has another attack of pancreatitis, would consider surgical referral for consideration of cholecystectomy. 4.  Follow-up with [facility] in 6-8 weeks.  Patient would ultimately benefit from elective screening colonoscopy.   _______________________________ eSigned:  Willis Modena, MD 04/20/2013 9:17 AM   CC:

## 2013-04-20 NOTE — Preoperative (Signed)
Beta Blockers   Reason not to administer Beta Blockers:Not Applicable 

## 2013-04-20 NOTE — Transfer of Care (Signed)
Immediate Anesthesia Transfer of Care Note  Patient: Lori Shah  Procedure(s) Performed: Procedure(s): ESOPHAGEAL ENDOSCOPIC ULTRASOUND (EUS) RADIAL (N/A)  Patient Location: PACU and Endoscopy Unit  Anesthesia Type:MAC  Level of Consciousness: awake, alert , oriented and patient cooperative  Airway & Oxygen Therapy: Patient Spontanous Breathing and Patient connected to face mask oxygen  Post-op Assessment: Report given to PACU RN, Post -op Vital signs reviewed and stable and Patient moving all extremities  Post vital signs: Reviewed and stable  Complications: No apparent anesthesia complications

## 2013-04-21 ENCOUNTER — Encounter (HOSPITAL_COMMUNITY): Payer: Self-pay | Admitting: Gastroenterology

## 2014-01-18 ENCOUNTER — Other Ambulatory Visit: Payer: Self-pay | Admitting: Orthopedic Surgery

## 2014-01-18 ENCOUNTER — Ambulatory Visit
Admission: RE | Admit: 2014-01-18 | Discharge: 2014-01-18 | Disposition: A | Discharge status: Home / Self Care | Payer: BC Managed Care – PPO | Source: Ambulatory Visit | Attending: Orthopedic Surgery | Admitting: Orthopedic Surgery

## 2014-01-18 DIAGNOSIS — R52 Pain, unspecified: Secondary | ICD-10-CM

## 2014-01-18 DIAGNOSIS — R609 Edema, unspecified: Secondary | ICD-10-CM

## 2014-06-21 DIAGNOSIS — D126 Benign neoplasm of colon, unspecified: Secondary | ICD-10-CM | POA: Diagnosis not present

## 2014-06-21 DIAGNOSIS — Z8371 Family history of colonic polyps: Secondary | ICD-10-CM | POA: Diagnosis not present

## 2014-06-21 DIAGNOSIS — Z1211 Encounter for screening for malignant neoplasm of colon: Secondary | ICD-10-CM | POA: Diagnosis not present

## 2014-06-21 DIAGNOSIS — Z800 Family history of malignant neoplasm of digestive organs: Secondary | ICD-10-CM | POA: Diagnosis not present

## 2014-08-29 DIAGNOSIS — Z9114 Patient's other noncompliance with medication regimen: Secondary | ICD-10-CM | POA: Diagnosis not present

## 2014-08-29 DIAGNOSIS — Z23 Encounter for immunization: Secondary | ICD-10-CM | POA: Diagnosis not present

## 2014-08-29 DIAGNOSIS — K850 Idiopathic acute pancreatitis: Secondary | ICD-10-CM | POA: Diagnosis not present

## 2014-08-29 DIAGNOSIS — E1165 Type 2 diabetes mellitus with hyperglycemia: Secondary | ICD-10-CM | POA: Diagnosis not present

## 2014-08-29 DIAGNOSIS — F17210 Nicotine dependence, cigarettes, uncomplicated: Secondary | ICD-10-CM | POA: Diagnosis not present

## 2014-08-29 DIAGNOSIS — E780 Pure hypercholesterolemia: Secondary | ICD-10-CM | POA: Diagnosis not present

## 2014-08-29 DIAGNOSIS — K2900 Acute gastritis without bleeding: Secondary | ICD-10-CM | POA: Diagnosis not present

## 2014-08-29 DIAGNOSIS — J309 Allergic rhinitis, unspecified: Secondary | ICD-10-CM | POA: Diagnosis not present

## 2015-06-07 DIAGNOSIS — E780 Pure hypercholesterolemia: Secondary | ICD-10-CM | POA: Diagnosis not present

## 2015-06-07 DIAGNOSIS — N760 Acute vaginitis: Secondary | ICD-10-CM | POA: Diagnosis not present

## 2015-06-07 DIAGNOSIS — E1165 Type 2 diabetes mellitus with hyperglycemia: Secondary | ICD-10-CM | POA: Diagnosis not present

## 2015-06-07 DIAGNOSIS — R350 Frequency of micturition: Secondary | ICD-10-CM | POA: Diagnosis not present

## 2015-06-20 DIAGNOSIS — L03032 Cellulitis of left toe: Secondary | ICD-10-CM | POA: Diagnosis not present

## 2015-06-27 DIAGNOSIS — M79675 Pain in left toe(s): Secondary | ICD-10-CM | POA: Diagnosis not present

## 2015-08-06 DIAGNOSIS — Z23 Encounter for immunization: Secondary | ICD-10-CM | POA: Diagnosis not present

## 2015-08-06 DIAGNOSIS — E1165 Type 2 diabetes mellitus with hyperglycemia: Secondary | ICD-10-CM | POA: Diagnosis not present

## 2015-08-06 DIAGNOSIS — Z9114 Patient's other noncompliance with medication regimen: Secondary | ICD-10-CM | POA: Diagnosis not present

## 2015-09-13 DIAGNOSIS — Z9114 Patient's other noncompliance with medication regimen: Secondary | ICD-10-CM | POA: Diagnosis not present

## 2015-09-13 DIAGNOSIS — F17210 Nicotine dependence, cigarettes, uncomplicated: Secondary | ICD-10-CM | POA: Diagnosis not present

## 2015-09-13 DIAGNOSIS — E7800 Pure hypercholesterolemia, unspecified: Secondary | ICD-10-CM | POA: Diagnosis not present

## 2015-09-13 DIAGNOSIS — E1165 Type 2 diabetes mellitus with hyperglycemia: Secondary | ICD-10-CM | POA: Diagnosis not present

## 2016-08-06 ENCOUNTER — Other Ambulatory Visit: Payer: Self-pay | Admitting: Family Medicine

## 2016-08-06 DIAGNOSIS — Z1231 Encounter for screening mammogram for malignant neoplasm of breast: Secondary | ICD-10-CM

## 2016-09-04 ENCOUNTER — Ambulatory Visit
Admission: RE | Admit: 2016-09-04 | Discharge: 2016-09-04 | Disposition: A | Discharge status: Home / Self Care | Payer: Medicare Other | Source: Ambulatory Visit | Attending: Family Medicine | Admitting: Family Medicine

## 2016-09-04 DIAGNOSIS — Z1231 Encounter for screening mammogram for malignant neoplasm of breast: Secondary | ICD-10-CM

## 2018-03-10 ENCOUNTER — Ambulatory Visit (INDEPENDENT_AMBULATORY_CARE_PROVIDER_SITE_OTHER): Payer: Medicare Other

## 2018-03-10 ENCOUNTER — Ambulatory Visit (INDEPENDENT_AMBULATORY_CARE_PROVIDER_SITE_OTHER): Payer: Medicare Other | Admitting: Orthopedic Surgery

## 2018-03-10 ENCOUNTER — Encounter (INDEPENDENT_AMBULATORY_CARE_PROVIDER_SITE_OTHER): Payer: Self-pay | Admitting: Orthopedic Surgery

## 2018-03-10 DIAGNOSIS — M25561 Pain in right knee: Secondary | ICD-10-CM

## 2018-03-10 NOTE — Progress Notes (Signed)
Office Visit Note   Patient: Lori Shah           Date of Birth: 1951/02/15           MRN: [id number] Visit Date: 03/10/2018 Requested by: Kelton Pillar, MD [address] [place], [place] [address] PCP: Kelton Pillar, MD  Subjective: Chief Complaint  Patient presents with  . Left Knee - Pain    HPI: Head is a patient with right knee pain of 10 days duration.  Denies any history of injury.  States is painful to weight-bear.  He did have medial meniscal tear treated with arthroscopy 12/12/2013.  He has been using one crutch which is helped.  Aleve is also helped.  He is retired.  Denies much in the way of mechanical symptoms but he does report weakness giving way and swelling.              ROS: All systems reviewed are negative as they relate to the chief complaint within the history of present illness.  Patient denies  fevers or chills.   Assessment & Plan: Visit Diagnoses:  1. Right knee pain, unspecified chronicity     Plan: Pression is right knee pain with no definite effusion.  He may have meniscal pathology but I would like to try an injection into the knee first.  We will see him back in 6 weeks and decide for or against MRI scanning at that time  Follow-Up Instructions: Return in about 6 weeks (around 04/21/2018).   Orders:  Orders Placed This Encounter  Procedures  . XR KNEE 3 VIEW RIGHT   No orders of the defined types were placed in this encounter.     Procedures: Large Joint Inj: R knee on 03/14/2018 11:53 PM Indications: diagnostic evaluation, joint swelling and pain Details: 18 G 1.5 in needle, superolateral approach  Arthrogram: No  Medications: 5 mL lidocaine 1 %; 40 mg methylPREDNISolone acetate 40 MG/ML; 4 mL bupivacaine 0.25 % Outcome: tolerated well, no immediate complications Procedure, treatment alternatives, risks and benefits explained, specific risks discussed. Consent was given by the patient. Immediately prior to procedure a time  out was called to verify the correct patient, procedure, equipment, support staff and site/side marked as required. Patient was prepped and draped in the usual sterile fashion.       Clinical Data: No additional findings.  Objective: Vital Signs: There were no vitals taken for this visit.  Physical Exam:   Constitutional: Patient appears well-developed HEENT:  Head: Normocephalic Eyes:EOM are normal Neck: Normal range of motion Cardiovascular: Normal rate Pulmonary/chest: Effort normal Neurologic: Patient is alert Skin: Skin is warm Psychiatric: Patient has normal mood and affect    Ortho Exam: Ortho exam demonstrates trace swelling and effusion with medial greater than lateral joint line tenderness.  Extensor mechanism is intact.  Range of motion is nearly full.  Pedal pulses palpable.  Collateral and cruciate ligaments are stable.  Negative patellar apprehension.  Specialty Comments:  No specialty comments available.  Imaging: Xr Knee 3 View Right  Result Date: 03/10/2018 AP lateral merchant right knee reviewed.  No spurring or arthritis is present.  No fracture dislocation or effusion noted.  Bone quality and alignment looks normal.  Slight narrowing of the medial joint space on the left knee on the coronal view.  Normal right knee    PMFS History: Patient Active Problem List   Diagnosis Date Noted  . Acute pancreatitis 02/27/2013  . Type 2 diabetes mellitus (  [place]) 02/27/2013  . Hyperlipidemia 02/27/2013   Past Medical History:  Diagnosis Date  . Arthritis    hands  . Diabetes mellitus   . GERD (gastroesophageal reflux disease)   . Hyperlipidemia     History reviewed. No pertinent family history.  Past Surgical History:  Procedure Laterality Date  . EUS N/A 04/20/2013   Procedure: ESOPHAGEAL ENDOSCOPIC ULTRASOUND (EUS) RADIAL;  Surgeon: Arta Silence, MD;  Location: [facility] ENDOSCOPY;  Service: Endoscopy;  Laterality: N/A;  . TONSILLECTOMY    . TUBAL LIGATION      Social History   Occupational History  . Not on file  Tobacco Use  . Smoking status: Current Every Day Smoker    Packs/day: 0.50    Years: 40.00    Pack years: 20.00    Types: Cigarettes  . Smokeless tobacco: Never Used  Substance and Sexual Activity  . Alcohol use: No  . Drug use: No  . Sexual activity: Not on file

## 2018-03-14 DIAGNOSIS — M25561 Pain in right knee: Secondary | ICD-10-CM | POA: Diagnosis not present

## 2018-03-14 MED ORDER — METHYLPREDNISOLONE ACETATE 40 MG/ML IJ SUSP
40.0000 mg | INTRAMUSCULAR | Status: AC | PRN
  Administered 2018-03-14: 40 mg via INTRA_ARTICULAR

## 2018-03-14 MED ORDER — BUPIVACAINE HCL 0.25 % IJ SOLN
4.0000 mL | INTRAMUSCULAR | Status: AC | PRN
  Administered 2018-03-14: 4 mL via INTRA_ARTICULAR

## 2018-03-14 MED ORDER — LIDOCAINE HCL 1 % IJ SOLN
5.0000 mL | INTRAMUSCULAR | Status: AC | PRN
  Administered 2018-03-14: 5 mL

## 2018-04-23 ENCOUNTER — Encounter (INDEPENDENT_AMBULATORY_CARE_PROVIDER_SITE_OTHER): Payer: Self-pay | Admitting: Orthopedic Surgery

## 2018-04-23 ENCOUNTER — Ambulatory Visit (INDEPENDENT_AMBULATORY_CARE_PROVIDER_SITE_OTHER): Payer: Medicare Other | Admitting: Orthopedic Surgery

## 2018-04-23 DIAGNOSIS — M25561 Pain in right knee: Secondary | ICD-10-CM | POA: Diagnosis not present

## 2018-04-25 ENCOUNTER — Encounter (INDEPENDENT_AMBULATORY_CARE_PROVIDER_SITE_OTHER): Payer: Self-pay | Admitting: Orthopedic Surgery

## 2018-04-25 NOTE — Progress Notes (Signed)
Office Visit Note   Patient: Lori Shah           Date of Birth: [birth year]           MRN: [id number] Visit Date: 04/23/2018 Requested by: Kelton Pillar, MD [address] [place], [place] [address] PCP: Kelton Pillar, MD  Subjective: Chief Complaint  Patient presents with  . Right Knee - Follow-up    HPI: 69 is a patient with right knee pain.  She had a right knee injection 03/10/2018.  She states that the knee really aches all around.  She is taking Advil which helps her symptoms.  She has essentially had 1 day of relief with the injection.  Pain is on the posterior aspect of the knee.  She denies any back pain or radicular symptoms.              ROS: All systems reviewed are negative as they relate to the chief complaint within the history of present illness.  Patient denies  fevers or chills.   Assessment & Plan: Visit Diagnoses:  1. Right knee pain, unspecified chronicity     Plan: Probable meniscal pathology with failure of conservative management.  She really does not have any obvious arthritis on plain radiographs.  Plan MRI scan right knee to evaluate for meniscal pathology.  She may require an intervention based on the symptoms  Follow-Up Instructions: Return for after MRI.   Orders:  Orders Placed This Encounter  Procedures  . MR Knee Right w/o contrast   No orders of the defined types were placed in this encounter.     Procedures: No procedures performed   Clinical Data: No additional findings.  Objective: Vital Signs: There were no vitals taken for this visit.  Physical Exam:   Constitutional: Patient appears well-developed HEENT:  Head: Normocephalic Eyes:EOM are normal Neck: Normal range of motion Cardiovascular: Normal rate Pulmonary/chest: Effort normal Neurologic: Patient is alert Skin: Skin is warm Psychiatric: Patient has normal mood and affect    Ortho Exam: Orthopedic exam demonstrates full active and passive range  of motion of that right knee with medial and lateral joint line tenderness and positive McMurray compression testing.  Collateral and cruciate ligaments are stable.  Patellofemoral crepitus is minimal on both sides.  No groin pain with internal/external rotation of the leg.  No Baker's cyst palpable in the posterior aspect of the knee.  Specialty Comments:  No specialty comments available.  Imaging: No results found.   PMFS History: Patient Active Problem List   Diagnosis Date Noted  . Acute pancreatitis 02/27/2013  . Type 2 diabetes mellitus ([place]) 02/27/2013  . Hyperlipidemia 02/27/2013   Past Medical History:  Diagnosis Date  . Arthritis    hands  . Diabetes mellitus   . GERD (gastroesophageal reflux disease)   . Hyperlipidemia     History reviewed. No pertinent family history.  Past Surgical History:  Procedure Laterality Date  . EUS N/A 04/20/2013   Procedure: ESOPHAGEAL ENDOSCOPIC ULTRASOUND (EUS) RADIAL;  Surgeon: Arta Silence, MD;  Location: [facility] ENDOSCOPY;  Service: Endoscopy;  Laterality: N/A;  . TONSILLECTOMY    . TUBAL LIGATION     Social History   Occupational History  . Not on file  Tobacco Use  . Smoking status: Current Every Day Smoker    Packs/day: 0.50    Years: 40.00    Pack years: 20.00    Types: Cigarettes  . Smokeless tobacco: Never Used  Substance and Sexual  Activity  . Alcohol use: No  . Drug use: No  . Sexual activity: Not on file

## 2018-05-05 ENCOUNTER — Ambulatory Visit
Admission: RE | Admit: 2018-05-05 | Discharge: 2018-05-05 | Disposition: A | Discharge status: Home / Self Care | Payer: Medicare Other | Source: Ambulatory Visit | Attending: Orthopedic Surgery | Admitting: Orthopedic Surgery

## 2018-05-05 DIAGNOSIS — M25561 Pain in right knee: Secondary | ICD-10-CM

## 2018-05-06 ENCOUNTER — Encounter (INDEPENDENT_AMBULATORY_CARE_PROVIDER_SITE_OTHER): Payer: Self-pay | Admitting: Orthopedic Surgery

## 2018-05-06 ENCOUNTER — Ambulatory Visit (INDEPENDENT_AMBULATORY_CARE_PROVIDER_SITE_OTHER): Payer: Medicare Other | Admitting: Orthopedic Surgery

## 2018-05-06 DIAGNOSIS — S838X1D Sprain of other specified parts of right knee, subsequent encounter: Secondary | ICD-10-CM

## 2018-05-06 NOTE — Progress Notes (Signed)
Office Visit Note   Patient: Lori Shah           Date of Birth: [birth year]           MRN: [id number] Visit Date: 05/06/2018 Requested by: Kelton Pillar, MD [address] [place], [place] [address] PCP: Kelton Pillar, MD  Subjective: Chief Complaint  Patient presents with  . Right Knee - Follow-up    HPI: Lori Shah is a 66 year old patient with right knee pain.  She has had left knee arthroscopy in the past and did well with that.  She reports having an injury 2 months ago.  She has failed conservative measures including aspiration injection and activity modification as well as medication.  MRI scan consistent with medial meniscal tear with a flap as well as anterior horn lateral meniscal tear              ROS: All systems reviewed are negative as they relate to the chief complaint within the history of present illness.  Patient denies  fevers or chills.   Assessment & Plan: Visit Diagnoses:  1. Injury of meniscus of right knee, subsequent encounter     Plan: Impression is right knee medial meniscal tear with fairly minimal arthritis present.  Plan is arthroscopy and debridement of the medial meniscal tear and likely debridement of the anterior horn of the lateral meniscus which also has a small tear.  Risk and benefits discussed all questions answered.  This will be similar to her left knee which is doing well.  Follow-Up Instructions: No follow-ups on file.   Orders:  No orders of the defined types were placed in this encounter.  No orders of the defined types were placed in this encounter.     Procedures: No procedures performed   Clinical Data: No additional findings.  Objective: Vital Signs: There were no vitals taken for this visit.  Physical Exam:   Constitutional: Patient appears well-developed HEENT:  Head: Normocephalic Eyes:EOM are normal Neck: Normal range of motion Cardiovascular: Normal rate Pulmonary/chest: Effort normal Neurologic:  Patient is alert Skin: Skin is warm Psychiatric: Patient has normal mood and affect    Ortho Exam: Ortho exam demonstrates mild effusion of the right knee with antalgic gait to the right.  Pedal pulses palpable.  Collateral and cruciate ligaments are stable.  Medial joint line tenderness is present.  Specialty Comments:  No specialty comments available.  Imaging: No results found.   PMFS History: Patient Active Problem List   Diagnosis Date Noted  . Acute pancreatitis 02/27/2013  . Type 2 diabetes mellitus ([place]) 02/27/2013  . Hyperlipidemia 02/27/2013   Past Medical History:  Diagnosis Date  . Arthritis    hands  . Diabetes mellitus   . GERD (gastroesophageal reflux disease)   . Hyperlipidemia     History reviewed. No pertinent family history.  Past Surgical History:  Procedure Laterality Date  . EUS N/A 04/20/2013   Procedure: ESOPHAGEAL ENDOSCOPIC ULTRASOUND (EUS) RADIAL;  Surgeon: Arta Silence, MD;  Location: [facility];  Service: Endoscopy;  Laterality: N/A;  . TONSILLECTOMY    . TUBAL LIGATION     Social History   Occupational History  . Not on file  Tobacco Use  . Smoking status: Current Every Day Smoker    Packs/day: 0.50    Years: 40.00    Pack years: 20.00    Types: Cigarettes  . Smokeless tobacco: Never Used  Substance and Sexual Activity  . Alcohol use: No  .  Drug use: No  . Sexual activity: Not on file       

## 2018-05-24 DIAGNOSIS — M23221 Derangement of posterior horn of medial meniscus due to old tear or injury, right knee: Secondary | ICD-10-CM | POA: Diagnosis not present

## 2018-06-02 ENCOUNTER — Encounter (INDEPENDENT_AMBULATORY_CARE_PROVIDER_SITE_OTHER): Payer: Self-pay | Admitting: Orthopedic Surgery

## 2018-06-02 ENCOUNTER — Ambulatory Visit (INDEPENDENT_AMBULATORY_CARE_PROVIDER_SITE_OTHER): Payer: Medicare Other | Admitting: Orthopedic Surgery

## 2018-06-02 DIAGNOSIS — S838X1D Sprain of other specified parts of right knee, subsequent encounter: Secondary | ICD-10-CM

## 2018-06-02 NOTE — Progress Notes (Signed)
   Post-Op Visit Note   Patient: Lori Shah           Date of Birth: 1951/01/03           MRN: [id number] Visit Date: 06/02/2018 PCP: Kelton Pillar, MD   Assessment & Plan:  Chief Complaint:  Chief Complaint  Patient presents with  . Right Knee - Routine Post Op   Visit Diagnoses:  1. Injury of meniscus of right knee, subsequent encounter     Plan: It is a patient who is now a week out right knee arthroscopy partial medial and lateral meniscectomy.  Patient is been doing reasonably well.  On exam she has trace effusion but excellent range of motion.  Portal sutures removed.  Continue with stationary bike and quad strengthening exercises.  4-week return.  She is taking Advil for her pain.  Follow-Up Instructions: Return in about 4 weeks (around 06/30/2018).   Orders:  No orders of the defined types were placed in this encounter.  No orders of the defined types were placed in this encounter.   Imaging: No results found.  PMFS History: Patient Active Problem List   Diagnosis Date Noted  . Acute pancreatitis 02/27/2013  . Type 2 diabetes mellitus ([place]) 02/27/2013  . Hyperlipidemia 02/27/2013   Past Medical History:  Diagnosis Date  . Arthritis    hands  . Diabetes mellitus   . GERD (gastroesophageal reflux disease)   . Hyperlipidemia     History reviewed. No pertinent family history.  Past Surgical History:  Procedure Laterality Date  . EUS N/A 04/20/2013   Procedure: ESOPHAGEAL ENDOSCOPIC ULTRASOUND (EUS) RADIAL;  Surgeon: Arta Silence, MD;  Location: [facility];  Service: Endoscopy;  Laterality: N/A;  . TONSILLECTOMY    . TUBAL LIGATION     Social History   Occupational History  . Not on file  Tobacco Use  . Smoking status: Current Every Day Smoker    Packs/day: 0.50    Years: 40.00    Pack years: 20.00    Types: Cigarettes  . Smokeless tobacco: Never Used  Substance and Sexual Activity  . Alcohol use: No  . Drug use: No  . Sexual activity: Not  on file

## 2018-06-15 ENCOUNTER — Ambulatory Visit (INDEPENDENT_AMBULATORY_CARE_PROVIDER_SITE_OTHER): Payer: Medicare Other | Admitting: Orthopedic Surgery

## 2018-06-15 ENCOUNTER — Encounter (INDEPENDENT_AMBULATORY_CARE_PROVIDER_SITE_OTHER): Payer: Self-pay | Admitting: Orthopedic Surgery

## 2018-06-15 DIAGNOSIS — S838X1D Sprain of other specified parts of right knee, subsequent encounter: Secondary | ICD-10-CM

## 2018-06-15 NOTE — Progress Notes (Signed)
   Post-Op Visit Note   Patient: Lori Shah           Date of Birth: Feb 10, 1951           MRN: [id number] Visit Date: 06/15/2018 PCP: Kelton Pillar, MD   Assessment & Plan:  Chief Complaint:  Chief Complaint  Patient presents with  . Right Knee - Follow-up   Visit Diagnoses:  1. Injury of meniscus of right knee, subsequent encounter     Plan: Lori Shah presents in follow-up from right knee arthroscopy.  She is having a little swelling and pain.  On exam she has mild effusion.  Range of motion and gait improved.  Aspiration performed today of about 15 cc of fluid and we injected Toradol along with Marcaine.  Follow-up in October.  Continue with quad strengthening and range of motion exercises.  Follow-Up Instructions: No follow-ups on file.   Orders:  No orders of the defined types were placed in this encounter.  No orders of the defined types were placed in this encounter.   Imaging: No results found.  PMFS History: Patient Active Problem List   Diagnosis Date Noted  . Acute pancreatitis 02/27/2013  . Type 2 diabetes mellitus ([place]) 02/27/2013  . Hyperlipidemia 02/27/2013   Past Medical History:  Diagnosis Date  . Arthritis    hands  . Diabetes mellitus   . GERD (gastroesophageal reflux disease)   . Hyperlipidemia     History reviewed. No pertinent family history.  Past Surgical History:  Procedure Laterality Date  . EUS N/A 04/20/2013   Procedure: ESOPHAGEAL ENDOSCOPIC ULTRASOUND (EUS) RADIAL;  Surgeon: Arta Silence, MD;  Location: [facility];  Service: Endoscopy;  Laterality: N/A;  . TONSILLECTOMY    . TUBAL LIGATION     Social History   Occupational History  . Not on file  Tobacco Use  . Smoking status: Current Every Day Smoker    Packs/day: 0.50    Years: 40.00    Pack years: 20.00    Types: Cigarettes  . Smokeless tobacco: Never Used  Substance and Sexual Activity  . Alcohol use: No  . Drug use: No  . Sexual activity: Not on file

## 2018-06-23 ENCOUNTER — Encounter (HOSPITAL_COMMUNITY): Payer: Self-pay | Admitting: Emergency Medicine

## 2018-06-23 ENCOUNTER — Ambulatory Visit (HOSPITAL_COMMUNITY)
Admission: EM | Admit: 2018-06-23 | Discharge: 2018-06-23 | Disposition: A | Discharge status: Home / Self Care | Payer: Medicare Other | Attending: Family Medicine | Admitting: Family Medicine

## 2018-06-23 DIAGNOSIS — S1096XA Insect bite of unspecified part of neck, initial encounter: Secondary | ICD-10-CM

## 2018-06-23 DIAGNOSIS — W57XXXA Bitten or stung by nonvenomous insect and other nonvenomous arthropods, initial encounter: Secondary | ICD-10-CM

## 2018-06-23 MED ORDER — DOXYCYCLINE HYCLATE 100 MG PO CAPS: 100.0000 mg | ORAL_CAPSULE | Freq: Two times a day (BID) | ORAL | 0 refills | Status: DC

## 2018-06-23 MED ORDER — METHYLPREDNISOLONE ACETATE 40 MG/ML IJ SUSP
INTRAMUSCULAR | Status: AC
  Filled 2018-06-23: qty 1

## 2018-06-23 MED ORDER — METHYLPREDNISOLONE ACETATE 40 MG/ML IJ SUSP
40.0000 mg | Freq: Once | INTRAMUSCULAR | Status: AC
  Administered 2018-06-23: 40 mg via INTRAMUSCULAR

## 2018-06-23 NOTE — Discharge Instructions (Signed)
It was nice meeting you!!  Steroid injection in the clinic I will give you a prescription for an antibiotic and if the swelling is worse with more redness, pain or draining in the next few days go ahead and fill the prescription.

## 2018-06-23 NOTE — ED Provider Notes (Signed)
[facility]    CSN: [id number] Arrival date & time: 06/23/18  1008     History   Chief Complaint Chief Complaint  Patient presents with  . Possible Spider Bite    HPI Lori Shah is a 67 y.o. female.   Patient is a 67 year old female that presents with a spider bite to right lateral neck area.  She noticed this on Sunday.  Since the area has become more painful , swollen with lymphadenopathy.  Reports mild ear pain.  Patient denies any fever, chills, body aches, fatigue, night sweats, headache, nausea.  Denies any contact with plants.  She did not see the insect that bit her.  She has been taking Advil for her symptoms with no relief of pain and swelling.   ROS per HPI      Past Medical History:  Diagnosis Date  . Arthritis    hands  . Diabetes mellitus   . GERD (gastroesophageal reflux disease)   . Hyperlipidemia     Patient Active Problem List   Diagnosis Date Noted  . Acute pancreatitis 02/27/2013  . Type 2 diabetes mellitus ([place]) 02/27/2013  . Hyperlipidemia 02/27/2013    Past Surgical History:  Procedure Laterality Date  . EUS N/A 04/20/2013   Procedure: ESOPHAGEAL ENDOSCOPIC ULTRASOUND (EUS) RADIAL;  Surgeon: Arta Silence, MD;  Location: [facility];  Service: Endoscopy;  Laterality: N/A;  . TONSILLECTOMY    . TUBAL LIGATION      OB History   None      Home Medications    Prior to Admission medications   Medication Sig Start Date End Date Taking? Authorizing Provider  atorvastatin (LIPITOR) 20 MG tablet Take 20 mg by mouth daily.    [provider]  doxycycline (VIBRAMYCIN) 100 MG capsule Take 1 capsule (100 mg total) by mouth 2 (two) times daily. 06/23/18   Anarosa Kubisiak, Tressia Miners A, NP  glimepiride (AMARYL) 4 MG tablet Take 4 mg by mouth daily before breakfast.    [provider]  metFORMIN (GLUCOPHAGE) 1000 MG tablet Take 1 tablet (1,000 mg total) by mouth 2 (two) times daily with a meal. RESUME 1 tablet two times a day on  03/02/13 Patient not taking: Reported on 06/23/2018 03/02/13   Dellinger, Bobby Rumpf, PA-C  omeprazole (PRILOSEC) 20 MG capsule Take 20 mg by mouth daily.    [provider]    Family History No family history on file.  Social History Social History   Tobacco Use  . Smoking status: Current Every Day Smoker    Packs/day: 0.50    Years: 40.00    Pack years: 20.00    Types: Cigarettes  . Smokeless tobacco: Never Used  Substance Use Topics  . Alcohol use: No  . Drug use: No     Allergies   Other and Penicillins   Review of Systems Review of Systems   Physical Exam Triage Vital Signs ED Triage Vitals [06/23/18 1032]  Enc Vitals Group     BP (!) 143/77     Pulse Rate 60     Resp 16     Temp 98.3 F (36.8 C)     Temp src      SpO2 100 %     Weight      Height      Head Circumference      Peak Flow      Pain Score      Pain Loc      Pain Edu?  Excl. in [place]?    No data found.  Updated Vital Signs BP (!) 143/77   Pulse 60   Temp 98.3 F (36.8 C)   Resp 16   SpO2 100%   Visual Acuity Right Eye Distance:   Left Eye Distance:   Bilateral Distance:    Right Eye Near:   Left Eye Near:    Bilateral Near:     Physical Exam  Constitutional: She is oriented to person, place, and time. She appears well-developed and well-nourished.  Very pleasant. Non toxic or ill appearing.     HENT:  Head: Normocephalic and atraumatic.  Eyes: Conjunctivae are normal.  Neck: Normal range of motion. Neck supple.  Pulmonary/Chest: Effort normal.  Musculoskeletal: Normal range of motion.  Lymphadenopathy:    She has cervical adenopathy.  Neurological: She is alert and oriented to person, place, and time.  Skin: Skin is warm and dry.  Mild erythematous raised lesion to right lateral neck with tiny punctures. No crusting or draining.  Mild tenderness to area.   Psychiatric: She has a normal mood and affect.  Nursing note and vitals reviewed.    UC  Treatments / Results  Labs (all labs ordered are listed, but only abnormal results are displayed) Labs Reviewed - No data to display  EKG None  Radiology No results found.  Procedures Procedures (including critical care time)  Medications Ordered in UC Medications  methylPREDNISolone acetate (DEPO-MEDROL) injection 40 mg (40 mg Intramuscular Given 06/23/18 1141)    Initial Impression / Assessment and Plan / UC Course  I have reviewed the triage vital signs and the nursing notes.  Pertinent labs & imaging results that were available during my care of the patient were reviewed by me and considered in my medical decision making (see chart for details).     Steroid injection given for inflammatory process. Area does not currently look infected.  Printed out prescription for doxycyline for the patient to fill in the next couple of days if area becomes more erythematous, painful, swollen or draining is present.  Patient agreeable and understanding the plan. Final Clinical Impressions(s) / UC Diagnoses   Final diagnoses:  Insect bite, unspecified site, initial encounter     Discharge Instructions     It was nice meeting you!!  Steroid injection in the clinic I will give you a prescription for an antibiotic and if the swelling is worse with more redness, pain or draining in the next few days go ahead and fill the prescription.     ED Prescriptions    Medication Sig Dispense Auth. Provider   doxycycline (VIBRAMYCIN) 100 MG capsule Take 1 capsule (100 mg total) by mouth 2 (two) times daily. 20 capsule Loura Halt A, NP     Controlled Substance Prescriptions [place] Controlled Substance Registry consulted? Not Applicable   Orvan July, NP 06/23/18 1220

## 2018-06-23 NOTE — ED Triage Notes (Signed)
Pt states on Sunday she noticed two bite marks on the R side of her neck, states "I think they are spider bites". Pt states the bites are getting bigger.

## 2018-07-06 ENCOUNTER — Encounter (INDEPENDENT_AMBULATORY_CARE_PROVIDER_SITE_OTHER): Payer: Self-pay | Admitting: Orthopedic Surgery

## 2018-07-08 ENCOUNTER — Encounter (INDEPENDENT_AMBULATORY_CARE_PROVIDER_SITE_OTHER): Payer: Self-pay | Admitting: Orthopedic Surgery

## 2018-07-08 ENCOUNTER — Ambulatory Visit (INDEPENDENT_AMBULATORY_CARE_PROVIDER_SITE_OTHER): Payer: Medicare Other | Admitting: Orthopedic Surgery

## 2018-07-08 DIAGNOSIS — S838X1D Sprain of other specified parts of right knee, subsequent encounter: Secondary | ICD-10-CM

## 2018-07-08 NOTE — Progress Notes (Signed)
   Post-Op Visit Note   Patient: Lori Shah           Date of Birth: [birth year]           MRN: [id number] Visit Date: 07/08/2018 PCP: Kelton Pillar, MD   Assessment & Plan:  Chief Complaint:  Chief Complaint  Patient presents with  . Right Knee - Follow-up   Visit Diagnoses:  1. Injury of meniscus of right knee, subsequent encounter     Plan: Lori Shah is now 5 weeks out right knee arthroscopy and partial medial meniscectomy.  She is been doing well.  She is travel to [place] since I last seen her.  On exam she has no effusion good range of motion and excellent quad strength.  Plan is to continue non-loadbearing quad strengthening exercises and follow-up with me as needed.  Follow-Up Instructions: Return if symptoms worsen or fail to improve.   Orders:  No orders of the defined types were placed in this encounter.  No orders of the defined types were placed in this encounter.   Imaging: No results found.  PMFS History: Patient Active Problem List   Diagnosis Date Noted  . Acute pancreatitis 02/27/2013  . Type 2 diabetes mellitus ([place]) 02/27/2013  . Hyperlipidemia 02/27/2013   Past Medical History:  Diagnosis Date  . Arthritis    hands  . Diabetes mellitus   . GERD (gastroesophageal reflux disease)   . Hyperlipidemia     History reviewed. No pertinent family history.  Past Surgical History:  Procedure Laterality Date  . EUS N/A 04/20/2013   Procedure: ESOPHAGEAL ENDOSCOPIC ULTRASOUND (EUS) RADIAL;  Surgeon: Arta Silence, MD;  Location: [facility];  Service: Endoscopy;  Laterality: N/A;  . TONSILLECTOMY    . TUBAL LIGATION     Social History   Occupational History  . Not on file  Tobacco Use  . Smoking status: Current Every Day Smoker    Packs/day: 0.50    Years: 40.00    Pack years: 20.00    Types: Cigarettes  . Smokeless tobacco: Never Used  Substance and Sexual Activity  . Alcohol use: No  . Drug use: No  . Sexual activity: Not on file

## 2019-09-05 ENCOUNTER — Other Ambulatory Visit: Payer: Self-pay | Admitting: Family Medicine

## 2019-09-05 DIAGNOSIS — E2839 Other primary ovarian failure: Secondary | ICD-10-CM

## 2019-09-05 DIAGNOSIS — Z1231 Encounter for screening mammogram for malignant neoplasm of breast: Secondary | ICD-10-CM

## 2019-11-30 ENCOUNTER — Ambulatory Visit
Admission: RE | Admit: 2019-11-30 | Discharge: 2019-11-30 | Disposition: A | Discharge status: Home / Self Care | Payer: Medicare Other | Source: Ambulatory Visit | Attending: Family Medicine | Admitting: Family Medicine

## 2019-11-30 ENCOUNTER — Other Ambulatory Visit: Payer: Self-pay

## 2019-11-30 DIAGNOSIS — Z1231 Encounter for screening mammogram for malignant neoplasm of breast: Secondary | ICD-10-CM

## 2019-11-30 DIAGNOSIS — E2839 Other primary ovarian failure: Secondary | ICD-10-CM

## 2020-03-21 ENCOUNTER — Encounter (INDEPENDENT_AMBULATORY_CARE_PROVIDER_SITE_OTHER): Payer: Medicare Other | Admitting: Ophthalmology

## 2020-03-21 ENCOUNTER — Encounter (INDEPENDENT_AMBULATORY_CARE_PROVIDER_SITE_OTHER): Payer: Self-pay | Admitting: Ophthalmology

## 2020-03-21 ENCOUNTER — Other Ambulatory Visit: Payer: Self-pay

## 2020-03-21 ENCOUNTER — Ambulatory Visit (INDEPENDENT_AMBULATORY_CARE_PROVIDER_SITE_OTHER): Payer: Medicare Other | Admitting: Ophthalmology

## 2020-03-21 DIAGNOSIS — H2513 Age-related nuclear cataract, bilateral: Secondary | ICD-10-CM

## 2020-03-21 DIAGNOSIS — H40003 Preglaucoma, unspecified, bilateral: Secondary | ICD-10-CM | POA: Diagnosis not present

## 2020-03-21 DIAGNOSIS — H43821 Vitreomacular adhesion, right eye: Secondary | ICD-10-CM | POA: Diagnosis not present

## 2020-03-21 DIAGNOSIS — E119 Type 2 diabetes mellitus without complications: Secondary | ICD-10-CM | POA: Insufficient documentation

## 2020-03-21 HISTORY — DX: Age-related nuclear cataract, bilateral: H25.13

## 2020-03-21 NOTE — Assessment & Plan Note (Signed)

## 2020-03-21 NOTE — Progress Notes (Signed)
03/21/2020     CHIEF COMPLAINT Patient presents for Retina Follow Up   HISTORY OF PRESENT ILLNESS: Lori Shah is a 69 y.o. female who presents to the clinic today for:   HPI    Retina Follow Up    Patient presents with  Diabetic Retinopathy.  In both eyes.  This started 1 year ago.  Severity is mild.  Duration of 1 year.  Since onset it is stable.          Comments    1 Year Diabetic Exam OU  Pt reports fluctuating VA OU. Pt sts VA is better at times than at others. Pt sts cataracts are getting worse. LBS: pt does not check A1c: 8.0, 12/2019       Last edited by Rockie Neighbours, [place] on 03/21/2020  9:28 AM. (History)      Referring physician: Kelton Pillar, MD [address],  [place] [address]  HISTORICAL INFORMATION:   Selected notes from the [facility]: No current outpatient medications on file. (Ophthalmic Drugs)   No current facility-administered medications for this visit. (Ophthalmic Drugs)   Current Outpatient Medications (Other)  Medication Sig  . atorvastatin (LIPITOR) 20 MG tablet Take 20 mg by mouth daily.  Marland Kitchen doxycycline (VIBRAMYCIN) 100 MG capsule Take 1 capsule (100 mg total) by mouth 2 (two) times daily.  Marland Kitchen glimepiride (AMARYL) 4 MG tablet Take 4 mg by mouth daily before breakfast.  . metFORMIN (GLUCOPHAGE) 1000 MG tablet Take 1 tablet (1,000 mg total) by mouth 2 (two) times daily with a meal. RESUME 1 tablet two times a day on 03/02/13 (Patient not taking: Reported on 06/23/2018)  . omeprazole (PRILOSEC) 20 MG capsule Take 20 mg by mouth daily.   No current facility-administered medications for this visit. (Other)      REVIEW OF SYSTEMS:    ALLERGIES Allergies  Allergen Reactions  . Other Other (See Comments)    Allergic to [place] nuts,  - causes ear swelling  . Penicillins Hives and Swelling    PAST MEDICAL HISTORY Past Medical History:  Diagnosis Date  . Arthritis    hands   . Diabetes mellitus   . GERD (gastroesophageal reflux disease)   . Hyperlipidemia    Past Surgical History:  Procedure Laterality Date  . EUS N/A 04/20/2013   Procedure: ESOPHAGEAL ENDOSCOPIC ULTRASOUND (EUS) RADIAL;  Surgeon: Arta Silence, MD;  Location: [facility];  Service: Endoscopy;  Laterality: N/A;  . TONSILLECTOMY    . TUBAL LIGATION      FAMILY HISTORY History reviewed. No pertinent family history.  SOCIAL HISTORY Social History   Tobacco Use  . Smoking status: Current Every Day Smoker    Packs/day: 0.50    Years: 40.00    Pack years: 20.00    Types: Cigarettes  . Smokeless tobacco: Never Used  Substance Use Topics  . Alcohol use: No  . Drug use: No         OPHTHALMIC EXAM:  Base Eye Exam    Visual Acuity (ETDRS)      Right Left   Dist [place] 20/40 +1 20/40 +2   Dist ph [place] 20/20 -2 20/25 +2       Tonometry (Tonopen, 9:32 AM)      Right Left   Pressure 21 23       Pupils      Pupils Dark Light Shape React APD   Right  PERRL 4 3 Round Brisk None   Left PERRL 4 3 Round Brisk None       Visual Fields (Counting fingers)      Left Right    Full Full       Extraocular Movement      Right Left    Full Full       Neuro/Psych    Oriented x3: Yes   Mood/Affect: Normal       Dilation    Both eyes: 1.0% Mydriacyl, 2.5% Phenylephrine @ 9:32 AM        Slit Lamp and Fundus Exam    External Exam      Right Left   External Normal Normal       Slit Lamp Exam      Right Left   Lids/Lashes Normal Normal   Conjunctiva/Sclera White and quiet White and quiet   Cornea Clear Clear   Anterior Chamber Deep and quiet Deep and quiet   Iris Round and reactive Round and reactive   Lens 2+ Nuclear sclerosis 2+ Nuclear sclerosis   Anterior Vitreous Normal Normal       Fundus Exam      Right Left   Posterior Vitreous Normal Normal   Disc Normal Normal   C/D Ratio 0.45 0.5   Macula Normal Normal   Vessels no DR no DR   Periphery Normal Normal            IMAGING AND PROCEDURES  Imaging and Procedures for 03/21/20  OCT, Retina - OU - Both Eyes       Right Eye Quality was good. Scan locations included subfoveal. Central Foveal Thickness: 274. Progression has been stable. Findings include vitreomacular adhesion , normal foveal contour.   Left Eye Quality was good. Scan locations included subfoveal. Central Foveal Thickness: 275. Progression has been stable. Findings include normal foveal contour.   Notes D with normal foveal contour and OS with posterior vitreous detachment                ASSESSMENT/PLAN:  No problem-specific Assessment & Plan notes found for this encounter.      ICD-10-CM   1. Diabetes mellitus without complication (HCC)  Z02.5 OCT, Retina - OU - Both Eyes  2. 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 Vitreomacular adhesion of right eye  H43.821     1.  2.  3.  Ophthalmic Meds Ordered this visit:  No orders of the defined types were placed in this encounter.      No follow-ups on file.  There are no Patient Instructions on file for this visit.   Explained the diagnoses, plan, and follow up with the patient and they expressed understanding.  Patient expressed understanding of the importance of proper follow up care.   Clent Demark Ryler Laskowski M.D. Diseases & Surgery of the Retina and Vitreous [facility] [place] 03/21/20     Abbreviations: M myopia (nearsighted); A astigmatism; H hyperopia (farsighted); P presbyopia; Mrx spectacle prescription;  CTL contact lenses; OD right eye; OS left eye; OU both eyes  XT exotropia; ET esotropia; PEK punctate epithelial keratitis; PEE punctate epithelial erosions; DES dry eye syndrome; MGD meibomian gland dysfunction; ATs artificial tears; PFAT's preservative free artificial tears; [place] nuclear sclerotic cataract; PSC posterior subcapsular cataract; ERM epi-retinal membrane; PVD posterior vitreous detachment; RD retinal detachment; DM  diabetes mellitus; DR diabetic retinopathy; NPDR non-proliferative diabetic retinopathy; PDR proliferative diabetic retinopathy; CSME clinically significant macular edema; DME  diabetic macular edema; dbh dot blot hemorrhages; CWS cotton wool spot; POAG primary open angle glaucoma; C/D cup-to-disc ratio; HVF humphrey visual field; GVF goldmann visual field; OCT optical coherence tomography; IOP intraocular pressure; BRVO Branch retinal vein occlusion; CRVO central retinal vein occlusion; CRAO central retinal artery occlusion; BRAO branch retinal artery occlusion; RT retinal tear; SB scleral buckle; PPV pars plana vitrectomy; VH Vitreous hemorrhage; PRP panretinal laser photocoagulation; IVK intravitreal kenalog; VMT vitreomacular traction; MH Macular hole;  NVD neovascularization of the disc; NVE neovascularization elsewhere; AREDS age related eye disease study; ARMD age related macular degeneration; POAG primary open angle glaucoma; EBMD epithelial/anterior basement membrane dystrophy; ACIOL anterior chamber intraocular lens; IOL intraocular lens; PCIOL posterior chamber intraocular lens; Phaco/IOL phacoemulsification with intraocular lens placement; [place] photorefractive keratectomy; LASIK laser assisted in situ keratomileusis; HTN hypertension; DM diabetes mellitus; COPD chronic obstructive pulmonary disease

## 2020-03-21 NOTE — Assessment & Plan Note (Signed)
OD minor with no treatment but observation required

## 2020-03-21 NOTE — Assessment & Plan Note (Signed)
Cataract(s) account for the patient's complaint. I discussed the risks and benefits of cataract surgery. Options were explained to the patient. The patient understands that new glasses may not improve their vision and desires to have cataract surgery. I have recommended follow up with their general eye care doctor for evaluation and consideration of cataract extraction with new intraocular lens insertion. Refer to Dr. Gershon Crane

## 2020-07-21 ENCOUNTER — Ambulatory Visit: Payer: Medicare Other | Attending: Internal Medicine

## 2020-07-21 DIAGNOSIS — Z23 Encounter for immunization: Secondary | ICD-10-CM

## 2020-07-21 NOTE — Progress Notes (Signed)
   Covid-19 Vaccination Clinic  Name:  Lori Shah    MRN: [id number] DOB: 02-Feb-1951  07/21/2020  [name] was observed post Covid-19 immunization for 15 minutes without incident. She was provided with Vaccine Information Sheet and instruction to access the V-Safe system.   [name] was instructed to call 911 with any severe reactions post vaccine: Marland Kitchen Difficulty breathing  . Swelling of face and throat  . A fast heartbeat  . A bad rash all over body  . Dizziness and weakness

## 2021-01-02 ENCOUNTER — Emergency Department (HOSPITAL_COMMUNITY)
Admission: EM | Admit: 2021-01-02 | Discharge: 2021-01-03 | Disposition: A | Discharge status: Home / Self Care | Payer: Medicare Other | Attending: Emergency Medicine | Admitting: Emergency Medicine

## 2021-01-02 DIAGNOSIS — R1013 Epigastric pain: Secondary | ICD-10-CM | POA: Insufficient documentation

## 2021-01-02 DIAGNOSIS — K219 Gastro-esophageal reflux disease without esophagitis: Secondary | ICD-10-CM | POA: Insufficient documentation

## 2021-01-02 DIAGNOSIS — R748 Abnormal levels of other serum enzymes: Secondary | ICD-10-CM | POA: Insufficient documentation

## 2021-01-02 DIAGNOSIS — Z7984 Long term (current) use of oral hypoglycemic drugs: Secondary | ICD-10-CM | POA: Insufficient documentation

## 2021-01-02 DIAGNOSIS — F17210 Nicotine dependence, cigarettes, uncomplicated: Secondary | ICD-10-CM | POA: Insufficient documentation

## 2021-01-02 DIAGNOSIS — E119 Type 2 diabetes mellitus without complications: Secondary | ICD-10-CM | POA: Insufficient documentation

## 2021-01-02 DIAGNOSIS — R109 Unspecified abdominal pain: Secondary | ICD-10-CM

## 2021-01-02 LAB — CBC
HCT: 47.4 % — ABNORMAL HIGH (ref 36.0–46.0)
Hemoglobin: 15.6 g/dL — ABNORMAL HIGH (ref 12.0–15.0)
MCH: 28.3 pg (ref 26.0–34.0)
MCHC: 32.9 g/dL (ref 30.0–36.0)
MCV: 86 fL (ref 80.0–100.0)
Platelets: 330 10*3/uL (ref 150–400)
RBC: 5.51 MIL/uL — ABNORMAL HIGH (ref 3.87–5.11)
RDW: 13 % (ref 11.5–15.5)
WBC: 9 10*3/uL (ref 4.0–10.5)
nRBC: 0 % (ref 0.0–0.2)

## 2021-01-02 LAB — COMPREHENSIVE METABOLIC PANEL
ALT: 13 U/L (ref 0–44)
AST: 11 U/L — ABNORMAL LOW (ref 15–41)
Albumin: 3.9 g/dL (ref 3.5–5.0)
Alkaline Phosphatase: 78 U/L (ref 38–126)
Anion gap: 10 (ref 5–15)
BUN: 16 mg/dL (ref 8–23)
CO2: 23 mmol/L (ref 22–32)
Calcium: 9.7 mg/dL (ref 8.9–10.3)
Chloride: 104 mmol/L (ref 98–111)
Creatinine, Ser: 1.04 mg/dL — ABNORMAL HIGH (ref 0.44–1.00)
GFR, Estimated: 57 mL/min — ABNORMAL LOW (ref >60)
Glucose, Bld: 193 mg/dL — ABNORMAL HIGH (ref 70–99)
Potassium: 3.8 mmol/L (ref 3.5–5.1)
Sodium: 137 mmol/L (ref 135–145)
Total Bilirubin: 0.8 mg/dL (ref 0.3–1.2)
Total Protein: 7.2 g/dL (ref 6.5–8.1)

## 2021-01-02 LAB — URINALYSIS, ROUTINE W REFLEX MICROSCOPIC
Bilirubin Urine: NEGATIVE
Glucose, UA: >=500 mg/dL — AB
Hgb urine dipstick: NEGATIVE
Ketones, ur: NEGATIVE mg/dL
Leukocytes,Ua: NEGATIVE
Nitrite: NEGATIVE
Protein, ur: NEGATIVE mg/dL
Specific Gravity, Urine: 1.028 (ref 1.005–1.030)
pH: 5 (ref 5.0–8.0)

## 2021-01-02 LAB — LIPASE, BLOOD: Lipase: 66 U/L — ABNORMAL HIGH (ref 11–51)

## 2021-01-02 LAB — CBG MONITORING, ED: Glucose-Capillary: 161 mg/dL — ABNORMAL HIGH (ref 70–99)

## 2021-01-02 NOTE — ED Triage Notes (Signed)
Pt reports abd pain since Sunday and states every time she eats it hurts more and states it feels like needles are sticking her. Pt states she notice her stomach getting bigger since Monday.  And states last Bm since Saturday. Pt denies chest pain & SOB

## 2021-01-03 ENCOUNTER — Emergency Department (HOSPITAL_COMMUNITY): Payer: Medicare Other

## 2021-01-03 MED ORDER — ONDANSETRON HCL 4 MG/2ML IJ SOLN
4.0000 mg | Freq: Once | INTRAMUSCULAR | Status: AC
  Administered 2021-01-03: 4 mg via INTRAVENOUS
  Filled 2021-01-03: qty 2

## 2021-01-03 MED ORDER — ALUM & MAG HYDROXIDE-SIMETH 200-200-20 MG/5ML PO SUSP
30.0000 mL | Freq: Once | ORAL | Status: AC
  Administered 2021-01-03: 30 mL via ORAL
  Filled 2021-01-03: qty 30

## 2021-01-03 MED ORDER — PANTOPRAZOLE SODIUM 20 MG PO TBEC: 20.0000 mg | DELAYED_RELEASE_TABLET | Freq: Two times a day (BID) | ORAL | 0 refills | Status: DC

## 2021-01-03 MED ORDER — PANTOPRAZOLE SODIUM 40 MG PO TBEC
40.0000 mg | DELAYED_RELEASE_TABLET | Freq: Once | ORAL | Status: AC
  Administered 2021-01-03: 40 mg via ORAL
  Filled 2021-01-03: qty 1

## 2021-01-03 MED ORDER — LIDOCAINE VISCOUS HCL 2 % MT SOLN
15.0000 mL | Freq: Once | OROMUCOSAL | Status: AC
  Administered 2021-01-03: 15 mL via OROMUCOSAL
  Filled 2021-01-03: qty 15

## 2021-01-03 MED ORDER — IOHEXOL 300 MG/ML  SOLN
100.0000 mL | Freq: Once | INTRAMUSCULAR | Status: AC | PRN
  Administered 2021-01-03: 100 mL via INTRAVENOUS

## 2021-01-03 MED ORDER — FENTANYL CITRATE (PF) 100 MCG/2ML IJ SOLN
50.0000 ug | Freq: Once | INTRAMUSCULAR | Status: AC
  Administered 2021-01-03: 50 ug via INTRAVENOUS
  Filled 2021-01-03: qty 2

## 2021-01-03 MED ORDER — LACTATED RINGERS IV BOLUS
1000.0000 mL | Freq: Once | INTRAVENOUS | Status: AC
  Administered 2021-01-03: 1000 mL via INTRAVENOUS

## 2021-01-03 MED ORDER — SUCRALFATE 1 GM/10ML PO SUSP: 1.0000 g | Freq: Three times a day (TID) | ORAL | 0 refills | Status: DC

## 2021-01-03 NOTE — ED Provider Notes (Signed)
[facility] EMERGENCY DEPARTMENT Provider Note   CSN: [id number] Arrival date & time: 01/02/21  2008     History Chief Complaint  Patient presents with  . Abdominal Pain    Lori Shah is a 70 y.o. female.  70 year old female who smokes, history of diabetes indigestion and acute pancreatitis who presents to the emergency department today secondary to abdominal discomfort.  Patient states that for the last few days she has had episode of bloating, epigastric abdominal pain every time she eats.  She states that she feels that she is lost weight recently but that also feels like her abdomen is more swollen sometimes.  She states is not necessarily associated any certain type of food.  She states compliance to medications.  No alcohol.   Abdominal Pain      Past Medical History:  Diagnosis Date  . Arthritis    hands  . Diabetes mellitus   . GERD (gastroesophageal reflux disease)   . Hyperlipidemia     Patient Active Problem List   Diagnosis Date Noted  . Diabetes mellitus without complication ([place]) 93/26/7124  . Glaucoma suspect of both eyes 03/21/2020  . Vitreomacular adhesion of right eye 03/21/2020  . Nuclear sclerotic cataract of both eyes 03/21/2020  . Acute pancreatitis 02/27/2013  . Type 2 diabetes mellitus ([place]) 02/27/2013  . Hyperlipidemia 02/27/2013    Past Surgical History:  Procedure Laterality Date  . EUS N/A 04/20/2013   Procedure: ESOPHAGEAL ENDOSCOPIC ULTRASOUND (EUS) RADIAL;  Surgeon: Arta Silence, MD;  Location: [facility] ENDOSCOPY;  Service: Endoscopy;  Laterality: N/A;  . TONSILLECTOMY    . TUBAL LIGATION       OB History   No obstetric history on file.     No family history on file.  Social History   Tobacco Use  . Smoking status: Current Every Day Smoker    Packs/day: 0.50    Years: 40.00    Pack years: 20.00    Types: Cigarettes  . Smokeless tobacco: Never Used  Substance Use Topics  . Alcohol use: No  . Drug use: No     Home Medications Prior to Admission medications   Medication Sig Start Date End Date Taking? Authorizing Provider  JARDIANCE 10 MG TABS tablet Take 10 mg by mouth daily. 12/14/20  Yes [provider]  pantoprazole (PROTONIX) 20 MG tablet Take 1 tablet (20 mg total) by mouth 2 (two) times daily for 14 days. 01/03/21 01/17/21 Yes Necha Harries, Corene Cornea, MD  sucralfate (CARAFATE) 1 GM/10ML suspension Take 10 mLs (1 g total) by mouth 4 (four) times daily -  with meals and at bedtime. 01/03/21  Yes Hideo Googe, Corene Cornea, MD  metFORMIN (GLUCOPHAGE) 1000 MG tablet Take 1 tablet (1,000 mg total) by mouth 2 (two) times daily with a meal. RESUME 1 tablet two times a day on 03/02/13 Patient not taking: No sig reported 03/02/13   Dellinger, Bobby Rumpf, PA-C    Allergies    Atorvastatin, Other, Penicillins, and Simvastatin  Review of Systems   Review of Systems  Gastrointestinal: Positive for abdominal pain.  All other systems reviewed and are negative.   Physical Exam Updated Vital Signs BP 117/70   Pulse 60   Temp 98 F (36.7 C) (Oral)   Resp 19   SpO2 96%   Physical Exam Vitals and nursing note reviewed.  Constitutional:      Appearance: She is well-developed.  HENT:     Head: Normocephalic and atraumatic.     Nose:  No congestion or rhinorrhea.     Mouth/Throat:     Mouth: Mucous membranes are moist.  Eyes:     Pupils: Pupils are equal, round, and reactive to light.  Cardiovascular:     Rate and Rhythm: Normal rate and regular rhythm.  Pulmonary:     Effort: Pulmonary effort is normal. No respiratory distress.     Breath sounds: No stridor.  Abdominal:     General: Abdomen is flat. There is no distension.  Musculoskeletal:        General: No swelling or tenderness. Normal range of motion.     Cervical back: Normal range of motion.  Skin:    General: Skin is warm and dry.  Neurological:     General: No focal deficit present.     Mental Status: She is alert and oriented to person,  place, and time.     ED Results / Procedures / Treatments   Labs (all labs ordered are listed, but only abnormal results are displayed) Labs Reviewed  LIPASE, BLOOD - Abnormal; Notable for the following components:      Result Value   Lipase 66 (*)    All other components within normal limits  COMPREHENSIVE METABOLIC PANEL - Abnormal; Notable for the following components:   Glucose, Bld 193 (*)    Creatinine, Ser 1.04 (*)    AST 11 (*)    GFR, Estimated 57 (*)    All other components within normal limits  CBC - Abnormal; Notable for the following components:   RBC 5.51 (*)    Hemoglobin 15.6 (*)    HCT 47.4 (*)    All other components within normal limits  URINALYSIS, ROUTINE W REFLEX MICROSCOPIC - Abnormal; Notable for the following components:   Glucose, UA >=500 (*)    Bacteria, UA RARE (*)    All other components within normal limits  CBG MONITORING, ED - Abnormal; Notable for the following components:   Glucose-Capillary 161 (*)    All other components within normal limits    EKG None  Radiology CT ABDOMEN PELVIS W CONTRAST  Result Date: 01/03/2021 CLINICAL DATA:  Abdominal distension. Infection/abscess suspected. Abdominal pain. Elevated lipase. EXAM: CT ABDOMEN AND PELVIS WITH CONTRAST TECHNIQUE: Multidetector CT imaging of the abdomen and pelvis was performed using the standard protocol following bolus administration of intravenous contrast. CONTRAST:  156mL OMNIPAQUE IOHEXOL 300 MG/ML  SOLN COMPARISON:  CT abdomen pelvis 02/27/2013, ultrasound abdomen 02/28/2013 FINDINGS: Lower chest: No acute abnormality. Hepatobiliary: No focal liver abnormality. No gallstones, gallbladder wall thickening, or pericholecystic fluid. No biliary dilatation. Pancreas: No focal lesion. Normal pancreatic contour. No surrounding inflammatory changes. No main pancreatic ductal dilatation. No pseudocyst formation. Spleen: Normal in size without focal abnormality. Adrenals/Urinary Tract: No  adrenal nodule bilaterally. Bilateral kidneys enhance symmetrically. 4.3 cm fluid density lesion within the right kidney likely represents a simple renal cyst. No hydronephrosis. No hydroureter. The urinary bladder is unremarkable. Stomach/Bowel: Stomach is within normal limits. No evidence of bowel wall thickening or dilatation. Few scattered colonic diverticula. Appendix appears normal. Vascular/Lymphatic: No abdominal aorta or iliac aneurysm. Moderate severe calcified and noncalcified atherosclerotic plaque of the aorta and its branches. No abdominal, pelvic, or inguinal lymphadenopathy. Reproductive: Coarse calcification within the uterine wall likely represent degenerative fibroids. Bilateral tubal ligation noted. Otherwise uterus and bilateral adnexa are unremarkable. Other: No intraperitoneal free fluid. No intraperitoneal free gas. No organized fluid collection. Musculoskeletal: No abdominal wall hernia or abnormality. No suspicious lytic or blastic osseous lesions.  No acute displaced fracture. Grade 1 anterolisthesis of L4 on L5. IMPRESSION: 1. No acute intra-abdominal or intrapelvic abnormality to explain etiology of patient's symptoms. 2. Few scattered colonic diverticula with no associated acute diverticulitis. 3. Degenerative uterine fibroids. 4.  Aortic Atherosclerosis (ICD10-I70.0). Electronically Signed   By: Iven Finn M.D.   On: 01/03/2021 02:39    Procedures Procedures   Medications Ordered in ED Medications  fentaNYL (SUBLIMAZE) injection 50 mcg (50 mcg Intravenous Given 01/03/21 0226)  ondansetron (ZOFRAN) injection 4 mg (4 mg Intravenous Given 01/03/21 0201)  lactated ringers bolus 1,000 mL (1,000 mLs Intravenous New Bag/Given 01/03/21 0200)  iohexol (OMNIPAQUE) 300 MG/ML solution 100 mL (100 mLs Intravenous Contrast Given 01/03/21 0206)  lidocaine (XYLOCAINE) 2 % viscous mouth solution 15 mL (15 mLs Mouth/Throat Given 01/03/21 0254)  alum & mag hydroxide-simeth (MAALOX/MYLANTA)  200-200-20 MG/5ML suspension 30 mL (30 mLs Oral Given 01/03/21 0253)  pantoprazole (PROTONIX) EC tablet 40 mg (40 mg Oral Given 01/03/21 0254)    ED Course  I have reviewed the triage vital signs and the nursing notes.  Pertinent labs & imaging results that were available during my care of the patient were reviewed by me and considered in my medical decision making (see chart for details).    MDM Rules/Calculators/A&P                          Her abdominal exam is quite reassuring.  There is no evidence of peritonitis or focal tenderness.  She does not seem overly distended.  Her labs showed mildly elevated lipase however CT scan and story not really consistent with pancreatitis.  And the lipase is barely elevated not to the 3 times upper limit of normal we would expect.  No evidence of mesenteric ischemia, free air, bowel obstruction, cancer or other peritoneal disease CT scan.  Suspect this is likely gastritis versus peptic ulcer disease.  Will start on Protonix.  Advised her to quit smoking.  Advised her to reduce her fatty and spicy foods.  Will follow up with GI if not improving in 5 to 7 days.  Final Clinical Impression(s) / ED Diagnoses Final diagnoses:  Abdominal pain, unspecified abdominal location    Rx / DC Orders ED Discharge Orders         Ordered    pantoprazole (PROTONIX) 20 MG tablet  2 times daily        01/03/21 0308    sucralfate (CARAFATE) 1 GM/10ML suspension  3 times daily with meals & bedtime        01/03/21 0308    Ambulatory referral to Gastroenterology        01/03/21 0308           Luara Faye, Corene Cornea, MD 01/03/21 [phone]

## 2021-03-21 ENCOUNTER — Ambulatory Visit (INDEPENDENT_AMBULATORY_CARE_PROVIDER_SITE_OTHER): Payer: Medicare Other | Admitting: Ophthalmology

## 2021-03-21 ENCOUNTER — Other Ambulatory Visit: Payer: Self-pay

## 2021-03-21 ENCOUNTER — Encounter (INDEPENDENT_AMBULATORY_CARE_PROVIDER_SITE_OTHER): Payer: Self-pay | Admitting: Ophthalmology

## 2021-03-21 DIAGNOSIS — H43822 Vitreomacular adhesion, left eye: Secondary | ICD-10-CM

## 2021-03-21 DIAGNOSIS — H43821 Vitreomacular adhesion, right eye: Secondary | ICD-10-CM

## 2021-03-21 DIAGNOSIS — E119 Type 2 diabetes mellitus without complications: Secondary | ICD-10-CM | POA: Diagnosis not present

## 2021-03-21 DIAGNOSIS — Z961 Presence of intraocular lens: Secondary | ICD-10-CM | POA: Insufficient documentation

## 2021-03-21 NOTE — Progress Notes (Signed)
03/21/2021     CHIEF COMPLAINT Patient presents for Retina Follow Up (1 Year F/U OU//Pt sts she can see better at a distance s/p cataract sx OU. Pt denies new symptoms OU. Pt sts she is having a hard time with her reading glasses OU. Pt c/o dryness off and on OU, but sts, "they are doing much better."/A1c: unknown/LBS: 162, "a while")   HISTORY OF PRESENT ILLNESS: Lori Shah is a 70 y.o. female who presents to the clinic today for:   HPI     Retina Follow Up           Diagnosis: Other   Laterality: both eyes   Onset: 1 year ago   Severity: mild   Duration: 1 year   Course: stable   Comments: 1 Year F/U OU  Pt sts she can see better at a distance s/p cataract sx OU. Pt denies new symptoms OU. Pt sts she is having a hard time with her reading glasses OU. Pt c/o dryness off and on OU, but sts, "they are doing much better." A1c: unknown LBS: 162, "a while"       Last edited by Milly Jakob, Paia on 03/21/2021 10:51 AM.      Referring physician: Kelton Pillar, MD [address],  [place] [id number]  HISTORICAL INFORMATION:   Selected notes from the [place]: No current outpatient medications on file. (Ophthalmic Drugs)   No current facility-administered medications for this visit. (Ophthalmic Drugs)   Current Outpatient Medications (Other)  Medication Sig   JARDIANCE 10 MG TABS tablet Take 10 mg by mouth daily.   metFORMIN (GLUCOPHAGE) 1000 MG tablet Take 1 tablet (1,000 mg total) by mouth 2 (two) times daily with a meal. RESUME 1 tablet two times a day on 03/02/13 (Patient not taking: No sig reported)   pantoprazole (PROTONIX) 20 MG tablet Take 1 tablet (20 mg total) by mouth 2 (two) times daily for 14 days.   sucralfate (CARAFATE) 1 GM/10ML suspension Take 10 mLs (1 g total) by mouth 4 (four) times daily -  with meals and at bedtime.   No current facility-administered medications for this visit. (Other)       REVIEW OF SYSTEMS:    ALLERGIES Allergies  Allergen Reactions   Atorvastatin     Other reaction(s): rash   Other Other (See Comments)    Allergic to [place] nuts,  - causes ear swelling   Penicillins Hives and Swelling   Simvastatin     Other reaction(s): cramps in stomach    PAST MEDICAL HISTORY Past Medical History:  Diagnosis Date   Arthritis    hands   Diabetes mellitus    GERD (gastroesophageal reflux disease)    Hyperlipidemia    Nuclear sclerotic cataract of both eyes 03/21/2020   Past Surgical History:  Procedure Laterality Date   EUS N/A 04/20/2013   Procedure: ESOPHAGEAL ENDOSCOPIC ULTRASOUND (EUS) RADIAL;  Surgeon: Arta Silence, MD;  Location: [facility] END[facility];  Service: Endoscopy;  Laterality: N/A;   TONSILLECTOMY     TUBAL LIGATION      FAMILY HISTORY History reviewed. No pertinent family history.  SOCIAL HISTORY Social History   Tobacco Use   Smoking status: Every Day    Packs/day: 0.50    Years: 40.00    Pack years: 20.00    Types: Cigarettes   Smokeless tobacco: Never  Substance Use Topics   Alcohol use:  No   Drug use: No         OPHTHALMIC EXAM:  Base Eye Exam     Visual Acuity (ETDRS)       Right Left   Dist [place] 20/40 +2 20/20 -2   Dist ph [place] 20/20 -1          Tonometry (Tonopen, 10:55 AM)       Right Left   Pressure 17 19         Pupils       Pupils Dark Light Shape React APD   Right PERRL 5 4 Round Brisk None   Left PERRL 5 4 Round Brisk None         Visual Fields (Counting fingers)       Left Right    Full Full         Extraocular Movement       Right Left    Full Full         Neuro/Psych     Oriented x3: Yes   Mood/Affect: Normal         Dilation     Both eyes: 1.0% Mydriacyl, 2.5% Phenylephrine @ 10:55 AM           Slit Lamp and Fundus Exam     External Exam       Right Left   External Normal Normal         Slit Lamp Exam       Right Left   Lids/Lashes Normal  Normal   Conjunctiva/Sclera White and quiet White and quiet   Cornea Clear Clear   Anterior Chamber Deep and quiet Deep and quiet   Iris Round and reactive Round and reactive   Lens Centered posterior chamber intraocular lens Centered posterior chamber intraocular lens   Anterior Vitreous Normal Normal         Fundus Exam       Right Left   Posterior Vitreous Normal Normal   Disc Normal Normal   C/D Ratio 0.45 0.5   Macula Normal Normal   Vessels no DR no DR   Periphery Normal Normal            IMAGING AND PROCEDURES  Imaging and Procedures for 03/21/21  OCT, Retina - OU - Both Eyes       Right Eye Quality was good. Scan locations included subfoveal. Central Foveal Thickness: 314. Progression has been stable. Findings include normal foveal contour, vitreomacular adhesion .   Left Eye Quality was good. Scan locations included subfoveal. Central Foveal Thickness: 321. Progression has been stable. Findings include normal foveal contour, vitreomacular adhesion .   Notes OD with normal foveal contour and OS with physiologic VMA, no active maculopathy             ASSESSMENT/PLAN:  Pseudophakia, both eyes Stable OU  Diabetes mellitus without complication ([place]) No detectable diabetic retinopathyThe patient has diabetes without any evidence of retinopathy. The patient advised to maintain good blood glucose control, excellent blood pressure control, and favorable levels of cholesterol, low density lipoprotein, and high density lipoproteins. Follow up in 1 year was recommended. Explained that fluctuations in visual acuity , or "out of focus", may result from large variations of blood sugar control.  Vitreomacular adhesion of left eye Physiologic with no impact on macular topography     ICD-10-CM   1. Vitreomacular adhesion of right eye  H43.821 OCT, Retina - OU - Both Eyes    2. Diabetes mellitus  without complication ([facility])  F11.0 OCT, Retina - OU - Both Eyes     3. Pseudophakia, both eyes  Z96.1     4. Vitreomacular adhesion of left eye  H43.822       1.  No diabetic retinopathy progression over the last 1 year.  We will continue to monitor  2.  Excellent visual acuity response OU post cataract surgery each eye.  3.  Ophthalmic Meds Ordered this visit:  No orders of the defined types were placed in this encounter.      Return in about 1 year (around 03/21/2022) for OCT, DILATE OU.  There are no Patient Instructions on file for this visit.   Explained the diagnoses, plan, and follow up with the patient and they expressed understanding.  Patient expressed understanding of the importance of proper follow up care.   Clent Demark Ayuub Penley M.D. Diseases & Surgery of the Retina and Vitreous Retina & Diabetic [place] 03/21/21     Abbreviations: M myopia (nearsighted); A astigmatism; H hyperopia (farsighted); P presbyopia; Mrx spectacle prescription;  CTL contact lenses; OD right eye; OS left eye; OU both eyes  XT exotropia; ET esotropia; PEK punctate epithelial keratitis; PEE punctate epithelial erosions; DES dry eye syndrome; MGD meibomian gland dysfunction; ATs artificial tears; PFAT's preservative free artificial tears; [place] nuclear sclerotic cataract; PSC posterior subcapsular cataract; ERM epi-retinal membrane; PVD posterior vitreous detachment; RD retinal detachment; DM diabetes mellitus; DR diabetic retinopathy; NPDR non-proliferative diabetic retinopathy; PDR proliferative diabetic retinopathy; CSME clinically significant macular edema; DME diabetic macular edema; dbh dot blot hemorrhages; CWS cotton wool spot; POAG primary open angle glaucoma; C/D cup-to-disc ratio; HVF humphrey visual field; GVF goldmann visual field; OCT optical coherence tomography; IOP intraocular pressure; BRVO Branch retinal vein occlusion; CRVO central retinal vein occlusion; CRAO central retinal artery occlusion; BRAO branch retinal artery occlusion; RT retinal tear;  SB scleral buckle; PPV pars plana vitrectomy; VH Vitreous hemorrhage; PRP panretinal laser photocoagulation; IVK intravitreal kenalog; VMT vitreomacular traction; MH Macular hole;  NVD neovascularization of the disc; NVE neovascularization elsewhere; AREDS age related eye disease study; ARMD age related macular degeneration; POAG primary open angle glaucoma; EBMD epithelial/anterior basement membrane dystrophy; ACIOL anterior chamber intraocular lens; IOL intraocular lens; PCIOL posterior chamber intraocular lens; Phaco/IOL phacoemulsification with intraocular lens placement; [place] photorefractive keratectomy; LASIK laser assisted in situ keratomileusis; HTN hypertension; DM diabetes mellitus; COPD chronic obstructive pulmonary disease

## 2021-03-21 NOTE — Assessment & Plan Note (Signed)
No detectable diabetic retinopathy  The patient has diabetes without any evidence of retinopathy. The patient advised to maintain good blood glucose control, excellent blood pressure control, and favorable levels of cholesterol, low density lipoprotein, and high density lipoproteins. Follow up in 1 year was recommended. Explained that fluctuations in visual acuity , or "out of focus", may result from large variations of blood sugar control. 

## 2021-03-21 NOTE — Assessment & Plan Note (Signed)
Physiologic with no impact on macular topography

## 2021-03-21 NOTE — Assessment & Plan Note (Signed)
Stable OU 

## 2021-10-25 IMAGING — MG DIGITAL SCREENING BILAT W/ TOMO W/ CAD
8 series · 9 of 24 positions shown · non-contrast
Comparison: Previous exam(s).

CLINICAL DATA: Screening.

EXAM:
DIGITAL SCREENING BILATERAL MAMMOGRAM WITH TOMO AND CAD

[L CC synth-2D]
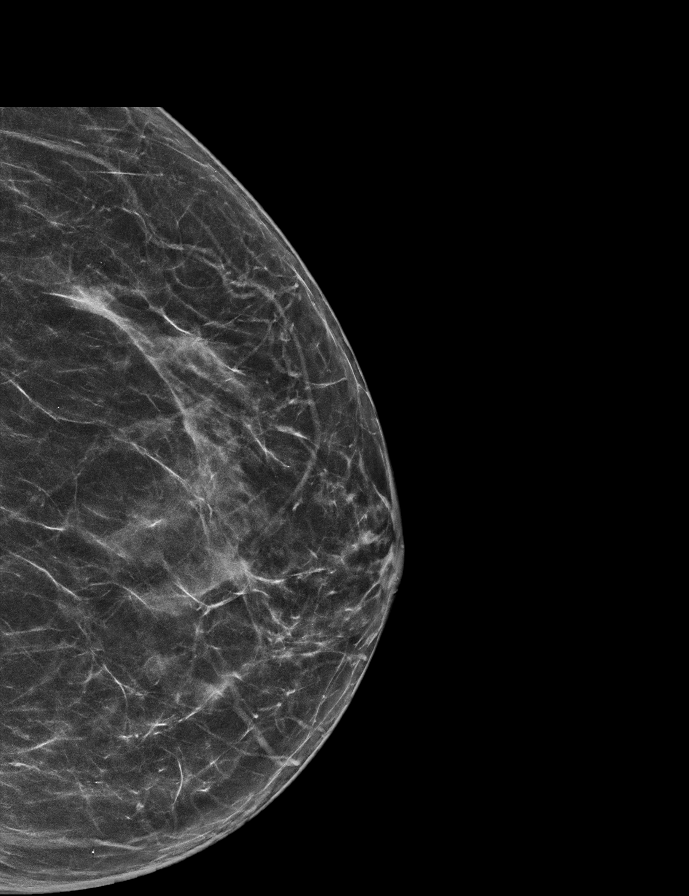

[R MLO synth-2D]
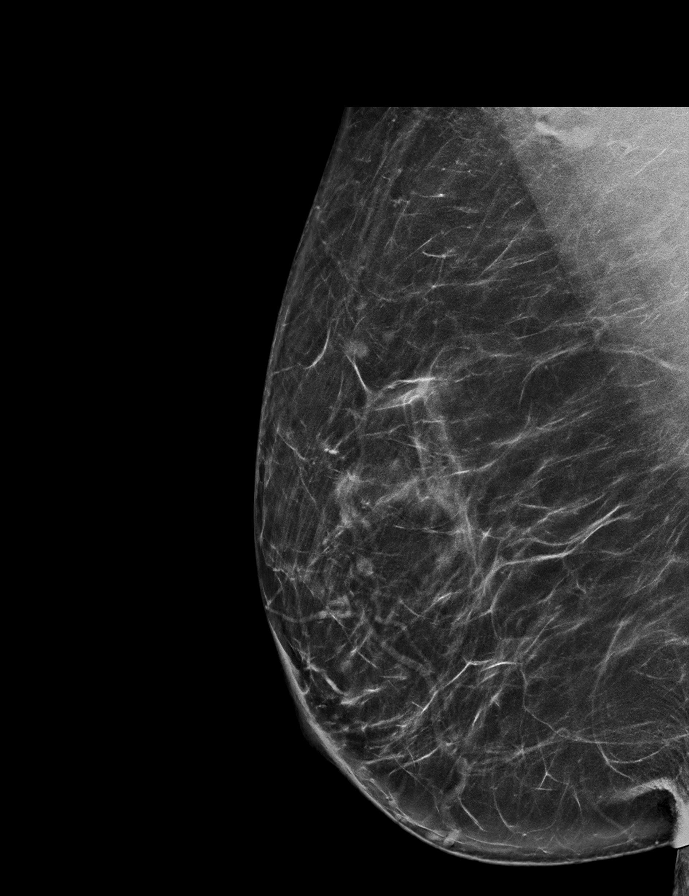

[R CC synth-2D]
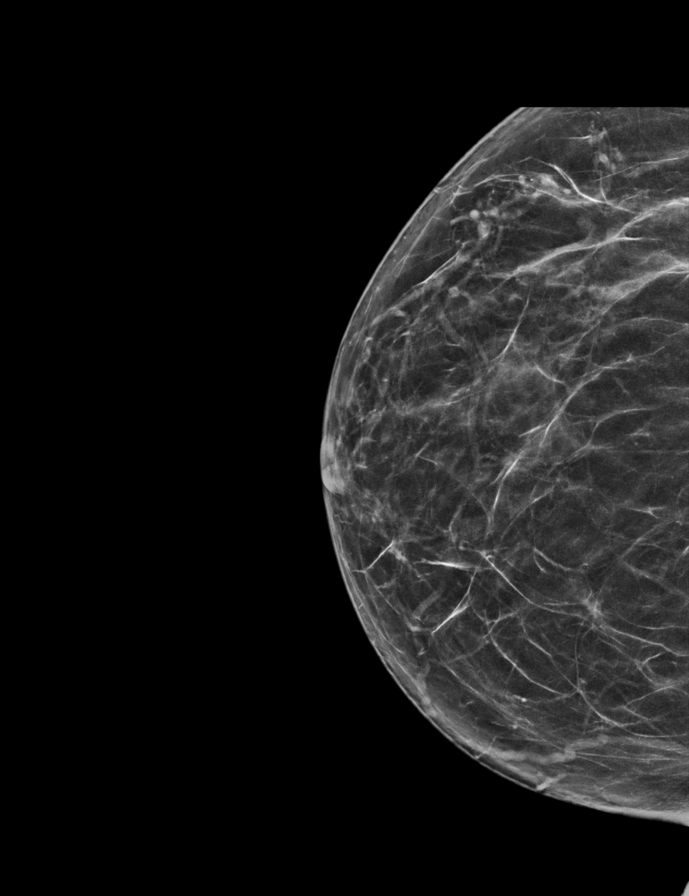

[L MLO synth-2D]
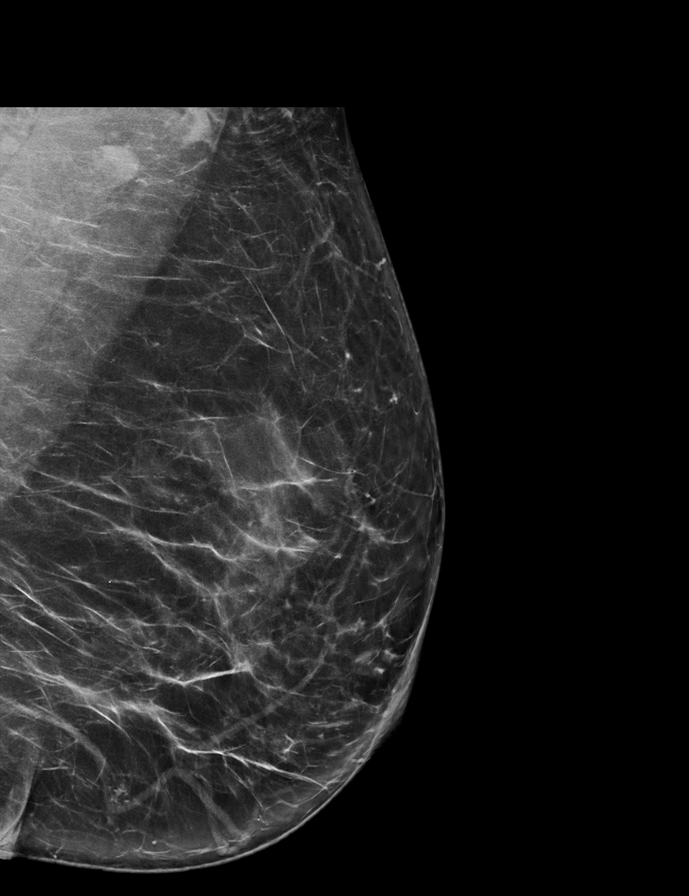

[R MLO tomo · 2 of 72 frames shown]
[frame 24/72]
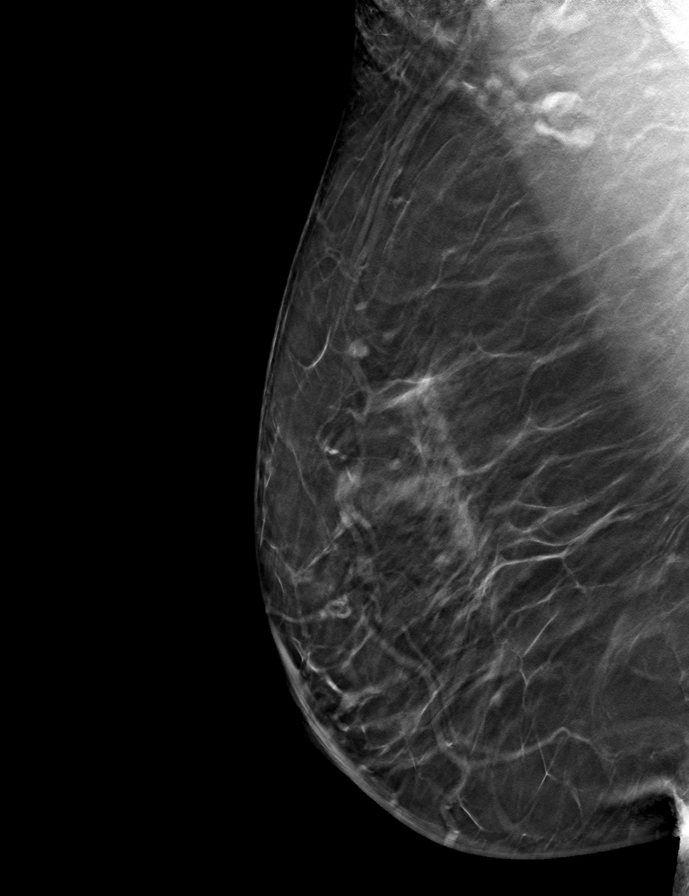
[frame 37/72]
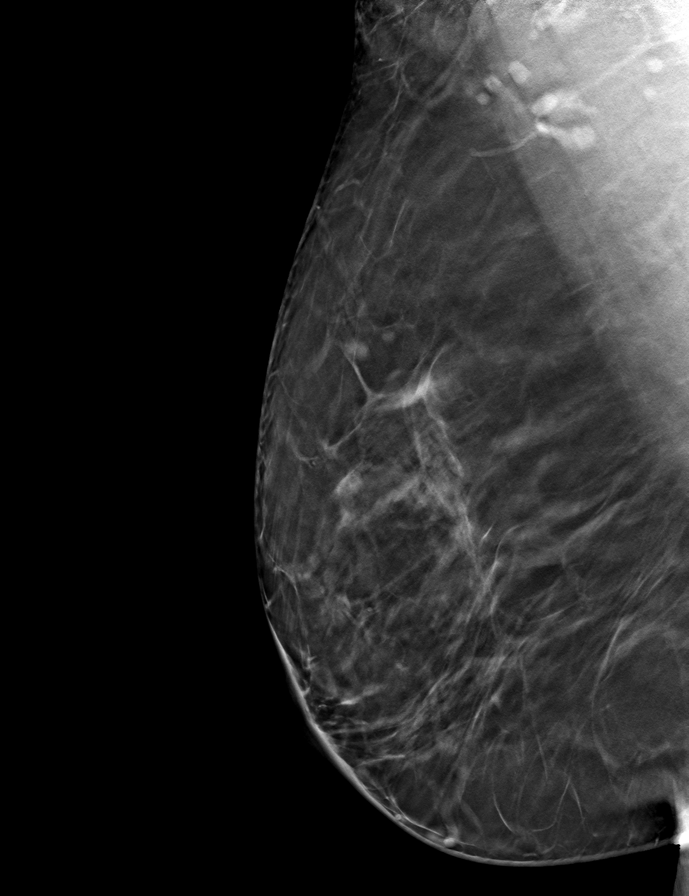

[R CC tomo · tomo slice 31/61.0]
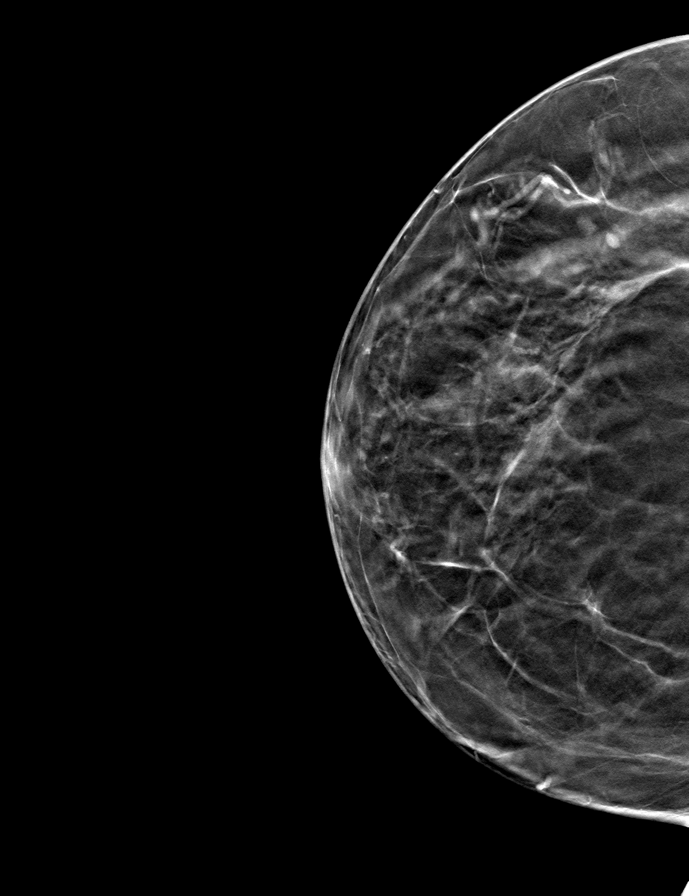

[L CC tomo · tomo slice 31/62.0]
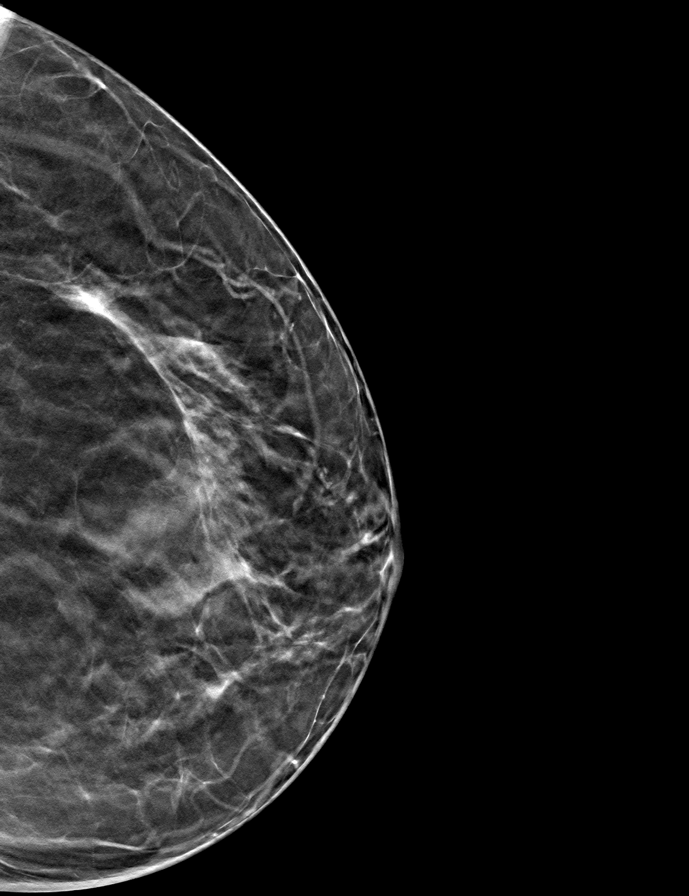

[L MLO tomo · tomo slice 39/76.0]
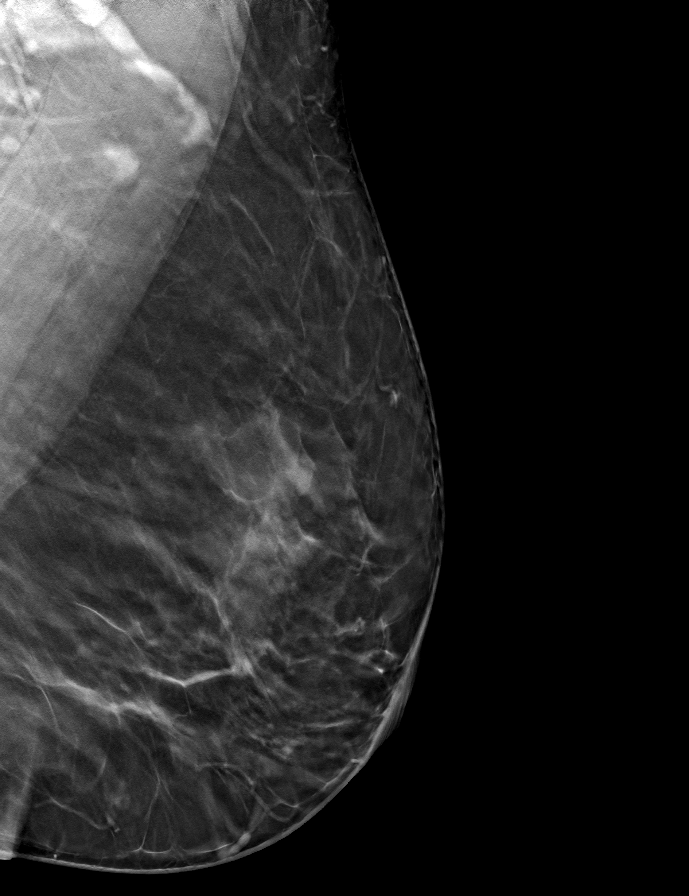

[9 of 24 positions shown; findings below may reference images not displayed]

ACR Breast Density Category b: There are scattered areas of
fibroglandular density.
FINDINGS: There are no findings suspicious for malignancy. Images were
processed with CAD.
IMPRESSION: No mammographic evidence of malignancy. A result letter of this
screening mammogram will be mailed directly to the patient.

RECOMMENDATION:
Screening mammogram in one year. (Code:[id number])

BI-RADS CATEGORY  1: Negative.

## 2022-03-27 ENCOUNTER — Ambulatory Visit (INDEPENDENT_AMBULATORY_CARE_PROVIDER_SITE_OTHER): Payer: Medicare Other | Admitting: Ophthalmology

## 2022-03-27 ENCOUNTER — Encounter (INDEPENDENT_AMBULATORY_CARE_PROVIDER_SITE_OTHER): Payer: Medicare Other | Admitting: Ophthalmology

## 2022-03-27 ENCOUNTER — Encounter (INDEPENDENT_AMBULATORY_CARE_PROVIDER_SITE_OTHER): Payer: Self-pay | Admitting: Ophthalmology

## 2022-03-27 DIAGNOSIS — E119 Type 2 diabetes mellitus without complications: Secondary | ICD-10-CM

## 2022-03-27 DIAGNOSIS — H43821 Vitreomacular adhesion, right eye: Secondary | ICD-10-CM | POA: Diagnosis not present

## 2022-03-27 DIAGNOSIS — H43822 Vitreomacular adhesion, left eye: Secondary | ICD-10-CM

## 2022-03-27 NOTE — Assessment & Plan Note (Signed)
Vitreomacular traction may cause vision loss from anatomic distortion to the center of the vision, the macula.  If visual function is symptomatic or threatened, therapy may be needed.  Surgical intervention offers the highest chance of visual stability and improvement.  Distortion of the macula anatomy may cause splitting of the retinal layers, termed foveomacular retinoschisis, which can cause more permanent vision loss.  Epiretinal membranes may also be associated.  Macular hole may also develop if vitreomacular traction progresses. The minor form of this condition is Vitreomacular adhesion, which is a natural change in the aging process of the eye, which requires observation only.  OU

## 2022-03-27 NOTE — Assessment & Plan Note (Signed)

## 2022-03-27 NOTE — Assessment & Plan Note (Signed)
Vitreomacular traction may cause vision loss from anatomic distortion to the center of the vision, the macula.  If visual function is symptomatic or threatened, therapy may be needed.  Surgical intervention offers the highest chance of visual stability and improvement.  Distortion of the macula anatomy may cause splitting of the retinal layers, termed foveomacular retinoschisis, which can cause more permanent vision loss.  Epiretinal membranes may also be associated.  Macular hole may also develop if vitreomacular traction progresses. The minor form of this condition is Vitreomacular adhesion, which is a natural change in the aging process of the eye, which requires observation only. 

## 2023-01-21 ENCOUNTER — Other Ambulatory Visit: Payer: Self-pay | Admitting: Internal Medicine

## 2023-01-21 DIAGNOSIS — E2839 Other primary ovarian failure: Secondary | ICD-10-CM

## 2023-04-01 ENCOUNTER — Encounter (INDEPENDENT_AMBULATORY_CARE_PROVIDER_SITE_OTHER): Payer: Medicare Other | Admitting: Ophthalmology

## 2023-04-03 ENCOUNTER — Other Ambulatory Visit: Payer: Self-pay | Admitting: Family Medicine

## 2023-04-03 DIAGNOSIS — H34219 Partial retinal artery occlusion, unspecified eye: Secondary | ICD-10-CM

## 2023-04-03 DIAGNOSIS — H34239 Retinal artery branch occlusion, unspecified eye: Secondary | ICD-10-CM

## 2023-04-07 ENCOUNTER — Ambulatory Visit
Admission: RE | Admit: 2023-04-07 | Discharge: 2023-04-07 | Disposition: A | Discharge status: Home / Self Care | Payer: Medicare Other | Source: Ambulatory Visit | Attending: Family Medicine | Admitting: Family Medicine

## 2023-04-07 DIAGNOSIS — H34219 Partial retinal artery occlusion, unspecified eye: Secondary | ICD-10-CM

## 2023-04-07 DIAGNOSIS — H34239 Retinal artery branch occlusion, unspecified eye: Secondary | ICD-10-CM

## 2023-06-17 ENCOUNTER — Emergency Department (HOSPITAL_COMMUNITY)
Admission: EM | Admit: 2023-06-17 | Discharge: 2023-06-17 | Disposition: A | Discharge status: Home / Self Care | Payer: Medicare Other | Attending: Emergency Medicine | Admitting: Emergency Medicine

## 2023-06-17 ENCOUNTER — Other Ambulatory Visit: Payer: Self-pay

## 2023-06-17 ENCOUNTER — Encounter (HOSPITAL_COMMUNITY): Payer: Self-pay

## 2023-06-17 DIAGNOSIS — K0889 Other specified disorders of teeth and supporting structures: Secondary | ICD-10-CM | POA: Diagnosis present

## 2023-06-17 DIAGNOSIS — Z7984 Long term (current) use of oral hypoglycemic drugs: Secondary | ICD-10-CM | POA: Insufficient documentation

## 2023-06-17 DIAGNOSIS — E119 Type 2 diabetes mellitus without complications: Secondary | ICD-10-CM | POA: Insufficient documentation

## 2023-06-17 DIAGNOSIS — K047 Periapical abscess without sinus: Secondary | ICD-10-CM | POA: Insufficient documentation

## 2023-06-17 MED ORDER — CLINDAMYCIN HCL 150 MG PO CAPS: 150.0000 mg | ORAL_CAPSULE | Freq: Four times a day (QID) | ORAL | 0 refills | Status: DC

## 2023-06-17 MED ORDER — OXYCODONE-ACETAMINOPHEN 5-325 MG PO TABS: 1.0000 | ORAL_TABLET | Freq: Four times a day (QID) | ORAL | 0 refills | Status: DC | PRN

## 2023-06-17 NOTE — Discharge Instructions (Addendum)
You can gargle with salt water few times a day as the abscess drains.  As prescription for antibiotic was sent to your pharmacy you can take the first dose tomorrow.  Call the dentist for follow-up.  A prescription for stronger pain medication was sent to your pharmacy however if Tylenol and ibuprofen take care of the pain you do not have to take the stronger pain medication.

## 2023-06-17 NOTE — ED Notes (Signed)
Patient verbalizes understanding of discharge instructions. Opportunity for questioning and answers were provided. Armband removed by staff, pt discharged from ED. Pt ambulatory to ED waiting room with steady gait.  

## 2023-06-17 NOTE — ED Provider Notes (Signed)
[place] EMERGENCY DEPARTMENT AT [facility] Provider Note   CSN: [id number] Arrival date & time: 06/17/23  1239     History  Chief Complaint  Patient presents with   Dental Pain    Lori Shah is a 72 y.o. female.  Patient is a 72 year old female with a history of diabetes, hyperlipidemia and GERD who is presenting today with ongoing dental pain for the last 2 days.  This morning she noticed some swelling in her face as well.  She went to her PCPs office and they gave her a shot of Toradol and Rocephin and sent her to the emergency room.  She is not having any trouble breathing has no trouble opening her mouth reports she has had a broken tooth for quite some time but this is the first time and is given her pain.  The history is provided by the patient.  Dental Pain      Home Medications Prior to Admission medications   Medication Sig Start Date End Date Taking? Authorizing Provider  clindamycin (CLEOCIN) 150 MG capsule Take 1 capsule (150 mg total) by mouth every 6 (six) hours. 06/17/23  Yes Gwyneth Sprout, MD  oxyCODONE-acetaminophen (PERCOCET/ROXICET) 5-325 MG tablet Take 1 tablet by mouth every 6 (six) hours as needed for severe pain. 06/17/23  Yes Ruthanne Mcneish, Alphonzo Lemmings, MD  JARDIANCE 10 MG TABS tablet Take 10 mg by mouth daily. 12/14/20   [provider]  metFORMIN (GLUCOPHAGE) 1000 MG tablet Take 1 tablet (1,000 mg total) by mouth 2 (two) times daily with a meal. RESUME 1 tablet two times a day on 03/02/13 Patient not taking: No sig reported 03/02/13   Dellinger, Tora Kindred, PA-C  pantoprazole (PROTONIX) 20 MG tablet Take 1 tablet (20 mg total) by mouth 2 (two) times daily for 14 days. 01/03/21 01/17/21  Mesner, Barbara Cower, MD  sucralfate (CARAFATE) 1 GM/10ML suspension Take 10 mLs (1 g total) by mouth 4 (four) times daily -  with meals and at bedtime. 01/03/21   Mesner, Barbara Cower, MD      Allergies    Atorvastatin, Other, Penicillins, and Simvastatin    Review of  Systems   Review of Systems  Physical Exam Updated Vital Signs BP 102/62   Pulse 83   Temp 98.1 F (36.7 C) (Oral)   Resp 17   Ht 5\' 2"  (1.575 m)   Wt 68.9 kg   SpO2 98%   BMI 27.80 kg/m  Physical Exam Vitals and nursing note reviewed.  Constitutional:      General: She is not in acute distress.    Appearance: She is well-developed.  HENT:     Head: Normocephalic and atraumatic.     Mouth/Throat:      Comments: No trismus, minimal left cheek swelling Eyes:     Pupils: Pupils are equal, round, and reactive to light.  Cardiovascular:     Rate and Rhythm: Normal rate and regular rhythm.     Heart sounds: Normal heart sounds. No murmur heard.    No friction rub.  Pulmonary:     Effort: Pulmonary effort is normal. No respiratory distress.  Musculoskeletal:        General: No tenderness. Normal range of motion.     Comments: No edema  Skin:    General: Skin is warm and dry.     Findings: No rash.  Neurological:     Mental Status: She is alert and oriented to person, place, and time.     Cranial  Nerves: No cranial nerve deficit.  Psychiatric:        Behavior: Behavior normal.     ED Results / Procedures / Treatments   Labs (all labs ordered are listed, but only abnormal results are displayed) Labs Reviewed - No data to display  EKG None  Radiology No results found.  Procedures Procedures   INCISION AND DRAINAGE Performed by: Gwyneth Sprout Consent: Verbal consent obtained. Risks and benefits: risks, benefits and alternatives were discussed Type: abscess  Body area: tooth  Anesthesia: local infiltration  Incision was made with a scalpel.  Local anesthetic: bupivicaine .25% with epinephrine  Anesthetic total: 2 ml  Complexity: simple Drainage: purulent  Drainage amount: 2mL  Packing material: none Patient tolerance: Patient tolerated the procedure well with no immediate complications.   Medications Ordered in ED Medications - No data to  display  ED Course/ Medical Decision Making/ A&P                                 Medical Decision Making Risk Prescription drug management.   Pt with dental caries and facial swelling.  No signs of ludwig's angina or difficulty swallowing and no systemic symptoms.  Abscess drained as above. Will treat with clindamycin due to PCN allergy and have pt f/u with dentist.         Final Clinical Impression(s) / ED Diagnoses Final diagnoses:  Dental abscess    Rx / DC Orders ED Discharge Orders          Ordered    oxyCODONE-acetaminophen (PERCOCET/ROXICET) 5-325 MG tablet  Every 6 hours PRN        06/17/23 1527    clindamycin (CLEOCIN) 150 MG capsule  Every 6 hours        06/17/23 1527              Gwyneth Sprout, MD 06/17/23 1530

## 2023-06-17 NOTE — ED Triage Notes (Signed)
Pt c/o left upper dental pain and swelling x 2 days. PT states one of her teeth broke a few months ago. PT was given some toradol and rocephin at her doctor's office today. Pt AxOx4.

## 2023-11-29 ENCOUNTER — Observation Stay (HOSPITAL_COMMUNITY): Payer: Medicare Other

## 2023-11-29 ENCOUNTER — Other Ambulatory Visit: Payer: Self-pay

## 2023-11-29 ENCOUNTER — Emergency Department (HOSPITAL_COMMUNITY): Payer: Medicare Other

## 2023-11-29 ENCOUNTER — Observation Stay (HOSPITAL_COMMUNITY)
Admission: EM | Admit: 2023-11-29 | Discharge: 2023-12-01 | Disposition: A | Discharge status: Home / Self Care | Payer: Medicare Other | Attending: Student | Admitting: Student

## 2023-11-29 ENCOUNTER — Encounter (HOSPITAL_COMMUNITY): Payer: Self-pay

## 2023-11-29 DIAGNOSIS — E785 Hyperlipidemia, unspecified: Secondary | ICD-10-CM | POA: Diagnosis not present

## 2023-11-29 DIAGNOSIS — Z87891 Personal history of nicotine dependence: Secondary | ICD-10-CM | POA: Diagnosis not present

## 2023-11-29 DIAGNOSIS — Z79899 Other long term (current) drug therapy: Secondary | ICD-10-CM | POA: Diagnosis not present

## 2023-11-29 DIAGNOSIS — I25110 Atherosclerotic heart disease of native coronary artery with unstable angina pectoris: Secondary | ICD-10-CM | POA: Diagnosis not present

## 2023-11-29 DIAGNOSIS — R079 Chest pain, unspecified: Secondary | ICD-10-CM | POA: Diagnosis present

## 2023-11-29 DIAGNOSIS — Z7984 Long term (current) use of oral hypoglycemic drugs: Secondary | ICD-10-CM | POA: Diagnosis not present

## 2023-11-29 DIAGNOSIS — I10 Essential (primary) hypertension: Secondary | ICD-10-CM | POA: Diagnosis not present

## 2023-11-29 DIAGNOSIS — E119 Type 2 diabetes mellitus without complications: Secondary | ICD-10-CM | POA: Diagnosis not present

## 2023-11-29 DIAGNOSIS — I214 Non-ST elevation (NSTEMI) myocardial infarction: Secondary | ICD-10-CM

## 2023-11-29 DIAGNOSIS — Z1152 Encounter for screening for COVID-19: Secondary | ICD-10-CM | POA: Diagnosis not present

## 2023-11-29 DIAGNOSIS — E1169 Type 2 diabetes mellitus with other specified complication: Secondary | ICD-10-CM | POA: Diagnosis present

## 2023-11-29 DIAGNOSIS — G459 Transient cerebral ischemic attack, unspecified: Secondary | ICD-10-CM | POA: Diagnosis present

## 2023-11-29 LAB — CBG MONITORING, ED: Glucose-Capillary: 305 mg/dL — ABNORMAL HIGH (ref 70–99)

## 2023-11-29 LAB — RESP PANEL BY RT-PCR (RSV, FLU A&B, COVID)  RVPGX2
Influenza A by PCR: NEGATIVE
Influenza B by PCR: NEGATIVE
Resp Syncytial Virus by PCR: NEGATIVE
SARS Coronavirus 2 by RT PCR: NEGATIVE

## 2023-11-29 LAB — BASIC METABOLIC PANEL
Anion gap: 14 (ref 5–15)
BUN: 13 mg/dL (ref 8–23)
CO2: 25 mmol/L (ref 22–32)
Calcium: 9.8 mg/dL (ref 8.9–10.3)
Chloride: 97 mmol/L — ABNORMAL LOW (ref 98–111)
Creatinine, Ser: 1.01 mg/dL — ABNORMAL HIGH (ref 0.44–1.00)
GFR, Estimated: 58 mL/min — ABNORMAL LOW (ref >60)
Glucose, Bld: 253 mg/dL — ABNORMAL HIGH (ref 70–99)
Potassium: 4.9 mmol/L (ref 3.5–5.1)
Sodium: 136 mmol/L (ref 135–145)

## 2023-11-29 LAB — CBC WITH DIFFERENTIAL/PLATELET
Abs Immature Granulocytes: 0.02 10*3/uL (ref 0.00–0.07)
Basophils Absolute: 0 10*3/uL (ref 0.0–0.1)
Basophils Relative: 0 %
Eosinophils Absolute: 0.1 10*3/uL (ref 0.0–0.5)
Eosinophils Relative: 1 %
HCT: 47.4 % — ABNORMAL HIGH (ref 36.0–46.0)
Hemoglobin: 15.9 g/dL — ABNORMAL HIGH (ref 12.0–15.0)
Immature Granulocytes: 0 %
Lymphocytes Relative: 29 %
Lymphs Abs: 2.2 10*3/uL (ref 0.7–4.0)
MCH: 28.1 pg (ref 26.0–34.0)
MCHC: 33.5 g/dL (ref 30.0–36.0)
MCV: 83.9 fL (ref 80.0–100.0)
Monocytes Absolute: 0.4 10*3/uL (ref 0.1–1.0)
Monocytes Relative: 5 %
Neutro Abs: 4.9 10*3/uL (ref 1.7–7.7)
Neutrophils Relative %: 65 %
Platelets: 250 10*3/uL (ref 150–400)
RBC: 5.65 MIL/uL — ABNORMAL HIGH (ref 3.87–5.11)
RDW: 12.4 % (ref 11.5–15.5)
WBC: 7.5 10*3/uL (ref 4.0–10.5)
nRBC: 0 % (ref 0.0–0.2)

## 2023-11-29 LAB — TROPONIN I (HIGH SENSITIVITY): Troponin I (High Sensitivity): 352 ng/L (ref <18)

## 2023-11-29 MED ORDER — INSULIN ASPART 100 UNIT/ML IJ SOLN
0.0000 [IU] | Freq: Three times a day (TID) | INTRAMUSCULAR | Status: DC
  Administered 2023-11-30: 2 [IU] via SUBCUTANEOUS
  Administered 2023-11-30: 5 [IU] via SUBCUTANEOUS
  Administered 2023-12-01: 3 [IU] via SUBCUTANEOUS
  Administered 2023-12-01: 8 [IU] via SUBCUTANEOUS

## 2023-11-29 MED ORDER — HEPARIN BOLUS VIA INFUSION
4000.0000 [IU] | Freq: Once | INTRAVENOUS | Status: DC
  Filled 2023-11-29: qty 4000

## 2023-11-29 MED ORDER — HEPARIN BOLUS VIA INFUSION
4000.0000 [IU] | Freq: Once | INTRAVENOUS | Status: AC
  Administered 2023-11-29: 4000 [IU] via INTRAVENOUS
  Filled 2023-11-29: qty 4000

## 2023-11-29 MED ORDER — ONDANSETRON HCL 4 MG PO TABS: 4.0000 mg | ORAL_TABLET | Freq: Four times a day (QID) | ORAL | Status: DC | PRN

## 2023-11-29 MED ORDER — ACETAMINOPHEN 325 MG PO TABS: 650.0000 mg | ORAL_TABLET | Freq: Four times a day (QID) | ORAL | Status: DC | PRN

## 2023-11-29 MED ORDER — ASPIRIN 81 MG PO TBEC
81.0000 mg | DELAYED_RELEASE_TABLET | Freq: Every day | ORAL | Status: DC
  Administered 2023-11-30 – 2023-12-01 (×2): 81 mg via ORAL
  Filled 2023-11-29 (×2): qty 1

## 2023-11-29 MED ORDER — ONDANSETRON HCL 4 MG/2ML IJ SOLN: 4.0000 mg | Freq: Four times a day (QID) | INTRAMUSCULAR | Status: DC | PRN

## 2023-11-29 MED ORDER — HEPARIN (PORCINE) 25000 UT/250ML-% IV SOLN
850.0000 [IU]/h | INTRAVENOUS | Status: DC
  Administered 2023-11-29: 850 [IU]/h via INTRAVENOUS
  Filled 2023-11-29: qty 250

## 2023-11-29 MED ORDER — INSULIN ASPART 100 UNIT/ML IJ SOLN
0.0000 [IU] | Freq: Every day | INTRAMUSCULAR | Status: DC
  Administered 2023-11-29: 4 [IU] via SUBCUTANEOUS
  Administered 2023-11-30: 3 [IU] via SUBCUTANEOUS

## 2023-11-29 MED ORDER — EMPAGLIFLOZIN 10 MG PO TABS
10.0000 mg | ORAL_TABLET | Freq: Every day | ORAL | Status: DC
  Administered 2023-11-30 – 2023-12-01 (×2): 10 mg via ORAL
  Filled 2023-11-29 (×2): qty 1

## 2023-11-29 MED ORDER — ACETAMINOPHEN 650 MG RE SUPP: 650.0000 mg | Freq: Four times a day (QID) | RECTAL | Status: DC | PRN

## 2023-11-29 MED ORDER — HEPARIN (PORCINE) 25000 UT/250ML-% IV SOLN: 850.0000 [IU]/h | INTRAVENOUS | Status: DC

## 2023-11-29 NOTE — Consult Note (Signed)
 Cardiology Consultation   Patient ID: XITLALY AULT MRN: [id number]; DOB: [birth year]  Admit date: 11/29/2023 Date of Consult: 11/29/2023  PCP:  [facility]   [facility] Providers Cardiologist:  None      Patient Profile:   Lori Shah is a 73 y.o. female with a hx of DM who is being seen 11/29/2023 for the evaluation of chest pain at the request of Dr. Marlyce Huge.  History of Present Illness:   Lori Shah states she woke up at 3 AM to go to the bathroom this morning and had severe substernal chest pain described as sharp and aching not associated with any symptoms.  Her pain persisted until she arrived to the ED and got medical therapy.  When she was in ED she had double vision for short period.  ECG showed normal sinus rhythm with nonspecific ST changes.  Troponins were 352 and the second is pending.  Creatinine is 1.01.  She has no prior cardiac history.  Denies edema, orthopnea, syncope, presyncope. She is chest pain free right now.    Past Medical History:  Diagnosis Date   Arthritis    hands   Diabetes mellitus    GERD (gastroesophageal reflux disease)    Hyperlipidemia    Nuclear sclerotic cataract of both eyes 03/21/2020    Past Surgical History:  Procedure Laterality Date   EUS N/A 04/20/2013   Procedure: ESOPHAGEAL ENDOSCOPIC ULTRASOUND (EUS) RADIAL;  Surgeon: Willis Modena, MD;  Location: [facility] ENDOSCOPY;  Service: Endoscopy;  Laterality: N/A;   TONSILLECTOMY     TUBAL LIGATION       Home Medications:  Prior to Admission medications   Medication Sig Start Date End Date Taking? Authorizing Provider  JARDIANCE 10 MG TABS tablet Take 10 mg by mouth daily. 12/14/20   [provider]  metFORMIN (GLUCOPHAGE) 1000 MG tablet Take 1 tablet (1,000 mg total) by mouth 2 (two) times daily with a meal. RESUME 1 tablet two times a day on 03/02/13 Patient not taking: No sig reported 03/02/13   Dellinger, Tora Kindred, PA-C  pantoprazole (PROTONIX) 20  MG tablet Take 1 tablet (20 mg total) by mouth 2 (two) times daily for 14 days. 01/03/21 01/17/21  Mesner, Barbara Cower, MD    Inpatient Medications: Scheduled Meds:  [START ON 11/30/2023] aspirin EC  81 mg Oral Daily   [START ON 11/30/2023] empagliflozin  10 mg Oral Daily   [START ON 11/30/2023] insulin aspart  0-15 Units Subcutaneous TID WC   insulin aspart  0-5 Units Subcutaneous QHS   Continuous Infusions:  heparin 850 Units/hr (11/29/23 2003)   PRN Meds: acetaminophen **OR** acetaminophen, ondansetron **OR** ondansetron (ZOFRAN) IV  Allergies:    Allergies  Allergen Reactions   Other Other (See Comments)    Allergic to [place] nuts,  - causes ear swelling   Penicillins Hives and Swelling    Other Reaction(s): hives and swelling   Simvastatin     Other reaction(s): cramps in stomach  Other Reaction(s): cramps in stomach   Atorvastatin Rash    Social History:   Social History   Socioeconomic History   Marital status: Married    Spouse name: Not on file   Number of children: Not on file   Years of education: Not on file   Highest education level: Not on file  Occupational History   Not on file  Tobacco Use   Smoking status: Former    Current packs/day: 0.50    Average  packs/day: 0.5 packs/day for 40.0 years (20.0 ttl pk-yrs)    Types: Cigarettes   Smokeless tobacco: Never  Substance and Sexual Activity   Alcohol use: No   Drug use: No   Sexual activity: Not on file  Other Topics Concern   Not on file  Social History Narrative   Not on file   Social Drivers of Health   Financial Resource Strain: Not on file  Food Insecurity: Not on file  Transportation Needs: Not on file  Physical Activity: Not on file  Stress: Not on file  Social Connections: Not on file  Intimate Partner Violence: Not on file    Family History:   History reviewed. No pertinent family history.   ROS:  Please see the history of present illness.   All other ROS reviewed and negative.      Physical Exam/Data:   Vitals:   11/29/23 1258 11/29/23 1620 11/29/23 1652 11/29/23 2000  BP: 135/73 (!) 84/71 (!) 143/54 (!) 128/55  Pulse: 72 67 (!) 58 75  Resp: 14 16 13 13   Temp: 98.8 F (37.1 C)  (!) 97.3 F (36.3 C)   TempSrc:   Oral   SpO2: 99% 96% 99% 97%   No intake or output data in the 24 hours ending 11/29/23 2046    06/17/2023    1:43 PM 04/20/2013    7:51 AM 02/27/2013   11:50 PM  Last 3 Weights  Weight (lbs) 152 lb 174 lb 178 lb 1.6 oz  Weight (kg) 68.947 kg 78.926 kg 80.786 kg     There is no height or weight on file to calculate BMI.  General:  Well nourished, well developed, in no acute distress HEENT: normal Neck: no JVD Vascular: No carotid bruits; Distal pulses 2+ bilaterally Cardiac:  normal S1, S2; RRR; soft systolic murmur  Lungs:  clear to auscultation bilaterally, no wheezing, rhonchi or rales  Abd: soft, nontender, no hepatomegaly  Ext: no edema Musculoskeletal:  No deformities, BUE and BLE strength normal and equal Skin: warm and dry  Neuro:  CNs 2-12 intact, no focal abnormalities noted Psych:  Normal affect   EKG:  The EKG was personally reviewed and demonstrates:  ECG showed normal sinus rhythm with nonspecific ST changes   Relevant CV Studies: None  Laboratory Data:  High Sensitivity Troponin:   Recent Labs  Lab 11/29/23 1617  TROPONINIHS 352*     Chemistry Recent Labs  Lab 11/29/23 1617  NA 136  K 4.9  CL 97*  CO2 25  GLUCOSE 253*  BUN 13  CREATININE 1.01*  CALCIUM 9.8  GFRNONAA 58*  ANIONGAP 14    No results for input(s): "PROT", "ALBUMIN", "AST", "ALT", "ALKPHOS", "BILITOT" in the last 168 hours. Lipids No results for input(s): "CHOL", "TRIG", "HDL", "LABVLDL", "LDLCALC", "CHOLHDL" in the last 168 hours.  Hematology Recent Labs  Lab 11/29/23 1617  WBC 7.5  RBC 5.65*  HGB 15.9*  HCT 47.4*  MCV 83.9  MCH 28.1  MCHC 33.5  RDW 12.4  PLT 250   Thyroid No results for input(s): "TSH", "FREET4" in the last  168 hours.  BNPNo results for input(s): "BNP", "PROBNP" in the last 168 hours.  DDimer No results for input(s): "DDIMER" in the last 168 hours.   Radiology/Studies:  CT Head Wo Contrast Result Date: 11/29/2023 CLINICAL DATA:  Sharp chest pain.  TIA. EXAM: CT HEAD WITHOUT CONTRAST TECHNIQUE: Contiguous axial images were obtained from the base of the skull through the vertex without intravenous  contrast. RADIATION DOSE REDUCTION: This exam was performed according to the departmental dose-optimization program which includes automated exposure control, adjustment of the mA and/or kV according to patient size and/or use of iterative reconstruction technique. COMPARISON:  MRI head 03/08/2023 FINDINGS: Brain: No acute intracranial hemorrhage. No focal mass lesion. No CT evidence of acute infarction. No midline shift or mass effect. No hydrocephalus. Basilar cisterns are patent. There are periventricular and subcortical white matter hypodensities. Generalized cortical atrophy. Vascular: No hyperdense vessel or unexpected calcification. Skull: Normal. Negative for fracture or focal lesion. Sinuses/Orbits: Paranasal sinuses and mastoid air cells are clear. Orbits are clear. Other: Artifact from LEFT earring noted IMPRESSION: 1. No acute intracranial findings. 2. Chronic microvascular ischemia and cortical atrophy. Electronically Signed   By: Genevive Bi M.D.   On: 11/29/2023 18:59   DG Chest 2 View Result Date: 11/29/2023 CLINICAL DATA:  Chest pain EXAM: CHEST - 2 VIEW COMPARISON:  None Available. FINDINGS: Normal lung volumes. No focal consolidations. No pleural effusion or pneumothorax. The heart size and mediastinal contours are within normal limits. No acute osseous abnormality. IMPRESSION: No active cardiopulmonary disease. Electronically Signed   By: Agustin Cree M.D.   On: 11/29/2023 14:33     Assessment and Plan:   NSTEMI DM Double vision - resolved. No ICH on CTH  Would start heparin and load  with aspirin. Make NPO at MN. Please obtain ECHO. Plan for LHC in the morning. Risk strat labs. Statin.     Risk Assessment/Risk Scores:   TIMI Risk Score for Unstable Angina or Non-ST Elevation MI:   The patient's TIMI risk score is 3, which indicates a 13% risk of all cause mortality, new or recurrent myocardial infarction or need for urgent revascularization in the next 14 days.{      For questions or updates, please contact [facility] Please consult [email or web address] for contact info under    Signed, Adline Mango, MD  11/29/2023 8:46 PM

## 2023-11-29 NOTE — ED Provider Notes (Signed)
 [place] EMERGENCY DEPARTMENT AT [facility] Provider Note   CSN: [id number] Arrival date & time: 11/29/23  1255     History  Chief Complaint  Patient presents with   Chest Pain    Lori Shah is a 73 y.o. female.  73 year old female presents today for concern of chest pain.  She states that this started overnight around midnight and then that resolved shortly after receiving aspirin and nitroglycerin in the emergency department.  Denies any history of cardiac disease.  She does have history of diabetes and endorses eye problems for which she sees a ophthalmologist.  She states in addition to the chest pain she has had blurry vision.  The blurry vision has resolved.  She denies shortness of breath, diaphoresis, nausea, lightheadedness associated with the chest pain.  No pleuritic chest pain.  She states she has remote history of blood clot but that was associated with birth control use back in the 1970s.  Not on anticoagulation since then, and has not had a recurrent blood clot.  The history is provided by the patient. No language interpreter was used.       Home Medications Prior to Admission medications   Medication Sig Start Date End Date Taking? Authorizing Provider  clindamycin (CLEOCIN) 150 MG capsule Take 1 capsule (150 mg total) by mouth every 6 (six) hours. 06/17/23   Gwyneth Sprout, MD  JARDIANCE 10 MG TABS tablet Take 10 mg by mouth daily. 12/14/20   [provider]  metFORMIN (GLUCOPHAGE) 1000 MG tablet Take 1 tablet (1,000 mg total) by mouth 2 (two) times daily with a meal. RESUME 1 tablet two times a day on 03/02/13 Patient not taking: No sig reported 03/02/13   Dellinger, Tora Kindred, PA-C  oxyCODONE-acetaminophen (PERCOCET/ROXICET) 5-325 MG tablet Take 1 tablet by mouth every 6 (six) hours as needed for severe pain. 06/17/23   Gwyneth Sprout, MD  pantoprazole (PROTONIX) 20 MG tablet Take 1 tablet (20 mg total) by mouth 2 (two) times daily for 14  days. 01/03/21 01/17/21  Mesner, Barbara Cower, MD  sucralfate (CARAFATE) 1 GM/10ML suspension Take 10 mLs (1 g total) by mouth 4 (four) times daily -  with meals and at bedtime. 01/03/21   Mesner, Barbara Cower, MD      Allergies    Atorvastatin, Other, Penicillins, and Simvastatin    Review of Systems   Review of Systems  Constitutional:  Negative for chills and fever.  Eyes:  Positive for visual disturbance.  Respiratory:  Negative for cough and shortness of breath.   Cardiovascular:  Positive for chest pain. Negative for palpitations and leg swelling.  Neurological:  Negative for light-headedness.  All other systems reviewed and are negative.   Physical Exam Updated Vital Signs BP (!) 84/71 (BP Location: Left Arm)   Pulse 67   Temp 98.8 F (37.1 C)   Resp 16   SpO2 96%  Physical Exam Vitals and nursing note reviewed.  Constitutional:      General: She is not in acute distress.    Appearance: Normal appearance. She is not ill-appearing.  HENT:     Head: Normocephalic and atraumatic.     Nose: Nose normal.  Eyes:     Conjunctiva/sclera: Conjunctivae normal.  Cardiovascular:     Rate and Rhythm: Normal rate and regular rhythm.  Pulmonary:     Effort: Pulmonary effort is normal. No respiratory distress.  Abdominal:     General: There is no distension.     Palpations: Abdomen  is soft.     Tenderness: There is no abdominal tenderness. There is no guarding.  Musculoskeletal:        General: No deformity. Normal range of motion.     Cervical back: Normal range of motion.  Skin:    Findings: No rash.  Neurological:     Mental Status: She is alert.     ED Results / Procedures / Treatments   Labs (all labs ordered are listed, but only abnormal results are displayed) Labs Reviewed  CBC WITH DIFFERENTIAL/PLATELET - Abnormal; Notable for the following components:      Result Value   RBC 5.65 (*)    Hemoglobin 15.9 (*)    HCT 47.4 (*)    All other components within normal limits   BASIC METABOLIC PANEL  TROPONIN I (HIGH SENSITIVITY)  TROPONIN I (HIGH SENSITIVITY)    EKG EKG Interpretation Date/Time:  Sunday November 29 2023 12:58:11 EST Ventricular Rate:  73 PR Interval:  170 QRS Duration:  84 QT Interval:  384 QTC Calculation: 423 R Axis:   25  Text Interpretation: Normal sinus rhythm Possible Left atrial enlargement Low voltage QRS  no significant change since May 2014 Confirmed by Pricilla Loveless [id number] on 11/29/2023 3:35:47 PM  Radiology DG Chest 2 View Result Date: 11/29/2023 CLINICAL DATA:  Chest pain EXAM: CHEST - 2 VIEW COMPARISON:  None Available. FINDINGS: Normal lung volumes. No focal consolidations. No pleural effusion or pneumothorax. The heart size and mediastinal contours are within normal limits. No acute osseous abnormality. IMPRESSION: No active cardiopulmonary disease. Electronically Signed   By: Agustin Cree M.D.   On: 11/29/2023 14:33    Procedures .Critical Care  Performed by: Marita Kansas, PA-C Authorized by: Marita Kansas, PA-C   Critical care provider statement:    Critical care time (minutes):  40   Critical care was necessary to treat or prevent imminent or life-threatening deterioration of the following conditions: ACS.   Critical care was time spent personally by me on the following activities:  Development of treatment plan with patient or surrogate, discussions with consultants, evaluation of patient's response to treatment, examination of patient, ordering and review of laboratory studies, ordering and review of radiographic studies, ordering and performing treatments and interventions, pulse oximetry, re-evaluation of patient's condition and review of old charts   Care discussed with: admitting provider       Medications Ordered in ED Medications - No data to display  ED Course/ Medical Decision Making/ A&P                                 Medical Decision Making Amount and/or Complexity of Data Reviewed Labs:  ordered. Radiology: ordered.  Risk Decision regarding hospitalization.   Medical Decision Making / ED Course   This patient presents to the ED for concern of chest pain, double vision, this involves an extensive number of treatment options, and is a complaint that carries with it a high risk of complications and morbidity.  The differential diagnosis includes ACS, PE, pneumonia, CVA, TIA, MSK pain, GERD  MDM: 73 year old female presents today for concern of chest pain.  This occurred overnight.  Resolved after receiving aspirin and nitro.  Low risk for PE on Wells criteria.  Also not hypoxic, tachypneic, or tachycardic.  CBC without leukocytosis or anemia.  BMP shows glucose of 253 otherwise without acute concern.  Respiratory panel negative.  Troponin initially 352.  Discussed with  cardiology.  Heparin initiated.  They recommend medicine admission due to patient having double vision due to concern for stroke.  CT head obtained.  No concern for acute hemorrhagic stroke.  Neurological exam otherwise reassuring.  No focal deficits.  Discussed with hospitalist who will evaluate patient for admission.   Lab Tests: -I ordered, reviewed, and interpreted labs.   The pertinent results include:   Labs Reviewed  CBC WITH DIFFERENTIAL/PLATELET - Abnormal; Notable for the following components:      Result Value   RBC 5.65 (*)    Hemoglobin 15.9 (*)    HCT 47.4 (*)    All other components within normal limits  RESP PANEL BY RT-PCR (RSV, FLU A&B, COVID)  RVPGX2  BASIC METABOLIC PANEL  TROPONIN I (HIGH SENSITIVITY)  TROPONIN I (HIGH SENSITIVITY)      EKG  EKG Interpretation Date/Time:  Sunday November 29 2023 12:58:11 EST Ventricular Rate:  73 PR Interval:  170 QRS Duration:  84 QT Interval:  384 QTC Calculation: 423 R Axis:   25  Text Interpretation: Normal sinus rhythm Possible Left atrial enlargement Low voltage QRS  no significant change since May 2014 Confirmed by Pricilla Loveless  [phone]) on 11/29/2023 3:35:47 PM         Imaging Studies ordered: I ordered imaging studies including chest x-ray, CT head I independently visualized and interpreted imaging. I agree with the radiologist interpretation   Medicines ordered and prescription drug management: No orders of the defined types were placed in this encounter.   -I have reviewed the patients home medicines and have made adjustments as needed  Critical interventions Heparin for ACS  Consultations Obtained: I requested consultation with the cardiology Dr. Piedad Climes,  and discussed lab and imaging findings as well as pertinent plan - they recommend: As above   Cardiac Monitoring: The patient was maintained on a cardiac monitor.  I personally viewed and interpreted the cardiac monitored which showed an underlying rhythm of: Normal sinus rhythm  Reevaluation: After the interventions noted above, I reevaluated the patient and found that they have :stayed the same  Co morbidities that complicate the patient evaluation  Past Medical History:  Diagnosis Date   Arthritis    hands   Diabetes mellitus    GERD (gastroesophageal reflux disease)    Hyperlipidemia    Nuclear sclerotic cataract of both eyes 03/21/2020      Dispostion: Discussed with hospitalist.  They will evaluate patient for admission.   Final Clinical Impression(s) / ED Diagnoses Final diagnoses:  NSTEMI (non-ST elevated myocardial infarction) [facility])    Rx / DC Orders ED Discharge Orders     None         Marita Kansas, PA-C 11/29/23 1936    Pricilla Loveless, MD 12/02/23 [phone]

## 2023-11-29 NOTE — ED Triage Notes (Addendum)
 Pt c.o sharp chest pain since 3am with sudden onset double vision at 11am that has now resolved. Pt states it lasted about 20 mins, no other symptoms during that time.   190/88 324 ASA 1 nitroglycerin Bp now 138/68 CBG 390

## 2023-11-29 NOTE — Progress Notes (Signed)
 PHARMACY - ANTICOAGULATION CONSULT NOTE  Pharmacy Consult for heparin Indication: chest pain/ACS  Allergies  Allergen Reactions   Atorvastatin     Other reaction(s): rash   Other Other (See Comments)    Allergic to [place] nuts,  - causes ear swelling   Penicillins Hives and Swelling   Simvastatin     Other reaction(s): cramps in stomach    Patient Measurements:   Heparin Dosing Weight: 65kg  Vital Signs: Temp: 97.3 F (36.3 C) (02/23 1652) Temp Source: Oral (02/23 1652) BP: 143/54 (02/23 1652) Pulse Rate: 58 (02/23 1652)  Labs: Recent Labs    11/29/23 1617  HGB 15.9*  HCT 47.4*  PLT 250  CREATININE 1.01*  TROPONINIHS 352*    CrCl cannot be calculated (Unknown ideal weight.).   Medical History: Past Medical History:  Diagnosis Date   Arthritis    hands   Diabetes mellitus    GERD (gastroesophageal reflux disease)    Hyperlipidemia    Nuclear sclerotic cataract of both eyes 03/21/2020    Assessment: 72 YOF presenting with CP, elevated troponin, she is not on anticoagulation PTA  Goal of Therapy:  Heparin level 0.3-0.7 units/ml Monitor platelets by anticoagulation protocol: Yes   Plan:  Heparin 4000 units IV x 1, and gtt at 850 units/hr F/u 8 hour heparin level F/u cards eval and recs  Daylene Posey, PharmD, [facility] Clinical Pharmacist ED Pharmacist Phone # [phone] 11/29/2023 5:47 PM

## 2023-11-29 NOTE — Hospital Course (Signed)
 Chest pain last night -cards consulted -elevated trop, awaiting repeat -heparin gtt   No neurological deficits on exam Double vision for 20 minutes

## 2023-11-29 NOTE — H&P (Addendum)
 History and Physical    Patient: Lori Shah NWG:[id number] DOB: June 01, 1951 DOA: 11/29/2023 DOS: the patient was seen and examined on 11/29/2023 PCP: [facility]  Patient coming from: Home  Chief Complaint:  Chief Complaint  Patient presents with   Chest Pain   HPI: Lori Shah is a 73 y.o. 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  Patient reports that she was in her usual state of health until around 3 AM today.  She states she woke up to use the restroom and came back in laid in bed.  Upon laying down in bed, she began having midsternal chest pain that was 10 out of 10 in severity.  She denies any radiation of her chest pain.  She denies any diaphoresis, palpitations, vomiting, fevers, chills, abdominal pain, urinary changes.  She does note that she was feeling nauseous around that time as well.  She did not come immediately to the ED and decided to wait it out.  She was able to attend church earlier today.  At church, a couple of her friends realized that she was not looking well and thus brought her to the ED for further evaluation.  She does note some double vision for about 10 to 15 minutes while she was at church.  After arrival to the ED, she reports that her chest pain subsided after receiving aspirin and nitroglycerin.  She denies any previous episodes of chest pain such as this.  Patient does have a history of type 2 diabetes for which she takes Jardiance at home.  She is not on any other diabetic medications.  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  ED course: Vital signs stable.  CBC unremarkable.  BMP with hyperglycemia to 253, creatinine 1.01 (around her baseline).  Respiratory panel negative for COVID, flu, RSV.  Initial troponin elevated to 352.  EKG showing normal sinus rhythm without any overt acute ischemic changes.  Chest  x-ray unremarkable.  CT head performed given transient double vision, negative for any acute intracranial abnormalities.  Patient started on heparin drip.  ED provider consulted cardiology.  [facility] hospitalist asked to evaluate patient for admission.  Review of Systems: As mentioned in the history of present illness. All other systems reviewed and are negative. Past Medical History:  Diagnosis Date   Arthritis    hands   Diabetes mellitus    GERD (gastroesophageal reflux disease)    Hyperlipidemia    Nuclear sclerotic cataract of both eyes 03/21/2020   Past Surgical History:  Procedure Laterality Date   EUS N/A 04/20/2013   Procedure: ESOPHAGEAL ENDOSCOPIC ULTRASOUND (EUS) RADIAL;  Surgeon: Willis Modena, MD;  Location: [facility];  Service: Endoscopy;  Laterality: N/A;   TONSILLECTOMY     TUBAL LIGATION     Social History:  reports that she has quit smoking. Her smoking use included cigarettes. She has a 20 pack-year smoking history. She has never used smokeless tobacco. She reports that she does not drink alcohol and does not use drugs.  Allergies  Allergen Reactions   Atorvastatin     Other reaction(s): rash   Other Other (See Comments)    Allergic to [place] nuts,  - causes ear swelling   Penicillins Hives and Swelling   Simvastatin     Other reaction(s): cramps in stomach    History reviewed. No pertinent family history.  Prior to  Admission medications   Medication Sig Start Date End Date Taking? Authorizing Provider  clindamycin (CLEOCIN) 150 MG capsule Take 1 capsule (150 mg total) by mouth every 6 (six) hours. 06/17/23   Gwyneth Sprout, MD  JARDIANCE 10 MG TABS tablet Take 10 mg by mouth daily. 12/14/20   [provider]  metFORMIN (GLUCOPHAGE) 1000 MG tablet Take 1 tablet (1,000 mg total) by mouth 2 (two) times daily with a meal. RESUME 1 tablet two times a day on 03/02/13 Patient not taking: No sig reported 03/02/13   Dellinger, Tora Kindred, PA-C   oxyCODONE-acetaminophen (PERCOCET/ROXICET) 5-325 MG tablet Take 1 tablet by mouth every 6 (six) hours as needed for severe pain. 06/17/23   Gwyneth Sprout, MD  pantoprazole (PROTONIX) 20 MG tablet Take 1 tablet (20 mg total) by mouth 2 (two) times daily for 14 days. 01/03/21 01/17/21  Mesner, Barbara Cower, MD  sucralfate (CARAFATE) 1 GM/10ML suspension Take 10 mLs (1 g total) by mouth 4 (four) times daily -  with meals and at bedtime. 01/03/21   Mesner, Barbara Cower, MD    Physical Exam: Vitals:   11/29/23 1258 11/29/23 1620 11/29/23 1652  BP: 135/73 (!) 84/71 (!) 143/54  Pulse: 72 67 (!) 58  Resp: 14 16 13   Temp: 98.8 F (37.1 C)  (!) 97.3 F (36.3 C)  TempSrc:   Oral  SpO2: 99% 96% 99%   Physical Exam Constitutional:      Appearance: Normal appearance. She is not ill-appearing.  HENT:     Head: Normocephalic and atraumatic.     Mouth/Throat:     Mouth: Mucous membranes are moist.     Pharynx: Oropharynx is clear. No oropharyngeal exudate.  Eyes:     General: No scleral icterus.    Extraocular Movements: Extraocular movements intact.     Conjunctiva/sclera: Conjunctivae normal.     Pupils: Pupils are equal, round, and reactive to light.  Cardiovascular:     Rate and Rhythm: Normal rate and regular rhythm.     Pulses: Normal pulses.     Heart sounds: Normal heart sounds. No murmur heard.    No friction rub. No gallop.  Pulmonary:     Effort: Pulmonary effort is normal.     Breath sounds: Normal breath sounds. No wheezing, rhonchi or rales.  Abdominal:     General: Bowel sounds are normal. There is no distension.     Palpations: Abdomen is soft.     Tenderness: There is no abdominal tenderness. There is no guarding or rebound.  Musculoskeletal:        General: No swelling. Normal range of motion.     Cervical back: Normal range of motion.  Skin:    General: Skin is warm and dry.  Neurological:     General: No focal deficit present.     Mental Status: She is alert and oriented to  person, place, and time.  Psychiatric:        Mood and Affect: Mood normal.        Behavior: Behavior normal.     Data Reviewed:  There are no new results to review at this time.    Latest Ref Rng & Units 11/29/2023    4:17 PM 01/02/2021    8:32 PM 03/01/2013    4:25 AM  CBC  WBC 4.0 - 10.5 K/uL 7.5  9.0  8.6   Hemoglobin 12.0 - 15.0 g/dL 40.9  81.1  91.4   Hematocrit 36.0 - 46.0 % 47.4  47.4  40.2   Platelets 150 - 400 K/uL 250  330  274       Latest Ref Rng & Units 11/29/2023    4:17 PM 01/02/2021    8:32 PM 03/01/2013    4:25 AM  CMP  Glucose 70 - 99 mg/dL 409  811  914   BUN 8 - 23 mg/dL 13  16  8    Creatinine 0.44 - 1.00 mg/dL 7.82  9.56  2.13   Sodium 135 - 145 mmol/L 136  137  138   Potassium 3.5 - 5.1 mmol/L 4.9  3.8  4.1   Chloride 98 - 111 mmol/L 97  104  103   CO2 22 - 32 mmol/L 25  23  24    Calcium 8.9 - 10.3 mg/dL 9.8  9.7  9.6   Total Protein 6.5 - 8.1 g/dL  7.2  7.3   Total Bilirubin 0.3 - 1.2 mg/dL  0.8  0.7   Alkaline Phos 38 - 126 U/L  78  101   AST 15 - 41 U/L  11  14   ALT 0 - 44 U/L  13  11    HS-Troponin: 352   Assessment and Plan: No notes have been filed under this hospital service. Service: Hospitalist  ACS Patient presenting with midsternal chest pain since around 3 AM today.  No prior history of heart disease or heart failure per patient.  Initial troponin elevated to 352, awaiting repeat.  EKG with normal sinus rhythm, no overt acute ischemic changes noted.  Appears to be NSTEMI.  Cardiology has been consulted by the ED provider.  Patient has been started on heparin drip.  Discussed with cardiology, Dr. Piedad Climes, recommended aspirin load (provided earlier in the ED) but to hold off on DAPT for now.  Patient is n.p.o. at this time in anticipation for possible cardiac catheterization tomorrow. -Cardiology following, appreciate assistance -Heparin drip -Received aspirin load in the ED, aspirin 81 mg daily from tomorrow -NPO -Zofran every 6 hours as  needed for nausea -Continue trending troponins until peak -f/u ECHO -Telemetry -Follow-up TSH, hemoglobin A1c -Follow-up lipid panel tomorrow morning, will likely need statin therapy -Place in observation/progressive  Transient double vision Patient reports double vision for about 15 minutes while at church today.  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  CT head done in the ED without any acute intracranial abnormalities noted.  She does report that her double vision has resolved.  She does not have any focal deficits on my exam. -Continue to monitor for signs of recurrence -Telemetry  Type 2 diabetes -Patient reports taking Jardiance 10 mg daily at home -Not taking metformin -No recent A1c on file, ordered on admission to assess glycemic control -Blood glucose 253 on CMP today -Started moderate SSI with at bedtime coverage -Resume Jardiance -Trend CBGs, goal 140-180  Hyperlipidemia -Not on any statin therapy at home -Follow-up lipid panel tomorrow morning  Vitreomacular adhesions of both eyes -Follows ophthalmology in the outpatient setting   Advance Care Planning:   Code Status: Limited: Do not attempt resuscitation (DNR) -DNR-LIMITED -Do Not Intubate/DNI  Discussed CODE status with patient at length. DNR-limited is best aligned with her wishes. She is AAOx4 and able to make this decision.  Consults: cardiology  Family Communication: updated friend at bedside  Severity of Illness: The appropriate patient status for this patient is OBSERVATION. Observation status is judged to be  reasonable and necessary in order to provide the required intensity of service to ensure the patient's safety. The patient's presenting symptoms, physical exam findings, and initial radiographic and laboratory data in the context of their medical condition is felt to place them at decreased risk for further clinical  deterioration. Furthermore, it is anticipated that the patient will be medically stable for discharge from the hospital within 2 midnights of admission.   Portions of this note were generated with Scientist, clinical (histocompatibility and immunogenetics). Dictation errors may occur despite best attempts at proofreading.   Author: Briscoe Burns, MD 11/29/2023 8:01 PM  For on call review [email or web address].

## 2023-11-29 NOTE — ED Provider Triage Note (Signed)
 Emergency Medicine Provider Triage Evaluation Note  Lori Shah , a 73 y.o. female  was evaluated in triage.  Pt complains of chest pain.  Review of Systems  Positive:  Negative:   Physical Exam  BP 135/73   Pulse 72   Temp 98.8 F (37.1 C)   Resp 14   SpO2 99%  Gen:   Awake, no distress   Resp:  Normal effort  MSK:   Moves extremities without difficulty  Other:    Medical Decision Making  Medically screening exam initiated at 1:58 PM.  Appropriate orders placed.  Lori Shah was informed that the remainder of the evaluation will be completed by another provider, this initial triage assessment does not replace that evaluation, and the importance of remaining in the ED until their evaluation is complete.  Chest pain x3am. Double vision starting around 11am that has since resolved - lasted around . Patient stating that chest pain is now feeling better - it did not immediately feel better after EMS gave NTG.   Valrie Hart F, [place] 11/29/23 1400

## 2023-11-30 ENCOUNTER — Observation Stay (HOSPITAL_BASED_OUTPATIENT_CLINIC_OR_DEPARTMENT_OTHER): Payer: Medicare Other

## 2023-11-30 ENCOUNTER — Encounter (HOSPITAL_COMMUNITY): Payer: Self-pay | Admitting: Cardiology

## 2023-11-30 ENCOUNTER — Encounter (HOSPITAL_COMMUNITY)
Admission: EM | Disposition: A | Discharge status: Home / Self Care | Payer: Self-pay | Source: Home / Self Care | Attending: Emergency Medicine

## 2023-11-30 DIAGNOSIS — R7989 Other specified abnormal findings of blood chemistry: Secondary | ICD-10-CM

## 2023-11-30 DIAGNOSIS — E118 Type 2 diabetes mellitus with unspecified complications: Secondary | ICD-10-CM

## 2023-11-30 DIAGNOSIS — I2510 Atherosclerotic heart disease of native coronary artery without angina pectoris: Secondary | ICD-10-CM

## 2023-11-30 DIAGNOSIS — I214 Non-ST elevation (NSTEMI) myocardial infarction: Secondary | ICD-10-CM

## 2023-11-30 DIAGNOSIS — E785 Hyperlipidemia, unspecified: Secondary | ICD-10-CM

## 2023-11-30 HISTORY — PX: LEFT HEART CATH AND CORONARY ANGIOGRAPHY: CATH118249

## 2023-11-30 LAB — GLUCOSE, CAPILLARY
Glucose-Capillary: 126 mg/dL — ABNORMAL HIGH (ref 70–99)
Glucose-Capillary: 95 mg/dL (ref 70–99)
Glucose-Capillary: 275 mg/dL — ABNORMAL HIGH (ref 70–99)

## 2023-11-30 LAB — CBC
HCT: 42.4 % (ref 36.0–46.0)
HCT: 42.1 % (ref 36.0–46.0)
Hemoglobin: 13.9 g/dL (ref 12.0–15.0)
Hemoglobin: 13.7 g/dL (ref 12.0–15.0)
MCH: 27.5 pg (ref 26.0–34.0)
MCH: 27.5 pg (ref 26.0–34.0)
MCHC: 32.8 g/dL (ref 30.0–36.0)
MCHC: 32.5 g/dL (ref 30.0–36.0)
MCV: 83.8 fL (ref 80.0–100.0)
MCV: 84.4 fL (ref 80.0–100.0)
Platelets: 242 10*3/uL (ref 150–400)
Platelets: 203 10*3/uL (ref 150–400)
RBC: 5.06 MIL/uL (ref 3.87–5.11)
RBC: 4.99 MIL/uL (ref 3.87–5.11)
RDW: 12.6 % (ref 11.5–15.5)
RDW: 12.7 % (ref 11.5–15.5)
WBC: 7.3 10*3/uL (ref 4.0–10.5)
WBC: 6.4 10*3/uL (ref 4.0–10.5)
nRBC: 0 % (ref 0.0–0.2)
nRBC: 0 % (ref 0.0–0.2)

## 2023-11-30 LAB — BASIC METABOLIC PANEL
Anion gap: 12 (ref 5–15)
BUN: 16 mg/dL (ref 8–23)
CO2: 21 mmol/L — ABNORMAL LOW (ref 22–32)
Calcium: 9.3 mg/dL (ref 8.9–10.3)
Chloride: 102 mmol/L (ref 98–111)
Creatinine, Ser: 1.13 mg/dL — ABNORMAL HIGH (ref 0.44–1.00)
GFR, Estimated: 51 mL/min — ABNORMAL LOW (ref >60)
Glucose, Bld: 288 mg/dL — ABNORMAL HIGH (ref 70–99)
Potassium: 3.7 mmol/L (ref 3.5–5.1)
Sodium: 135 mmol/L (ref 135–145)

## 2023-11-30 LAB — LIPID PANEL
Cholesterol: 268 mg/dL — ABNORMAL HIGH (ref 0–200)
HDL: 65 mg/dL (ref >40)
LDL Cholesterol: 174 mg/dL — ABNORMAL HIGH (ref 0–99)
Total CHOL/HDL Ratio: 4.1 {ratio}
Triglycerides: 146 mg/dL (ref <150)
VLDL: 29 mg/dL (ref 0–40)

## 2023-11-30 LAB — ECHOCARDIOGRAM COMPLETE
AR max vel: 1.71 cm2
AV Peak grad: 8 mm[Hg]
Ao pk vel: 1.41 m/s
Area-P 1/2: 2.42 cm2
Height: 62 in
P 1/2 time: 676 ms
S' Lateral: 2.6 cm
Weight: 2430.35 [oz_av]

## 2023-11-30 LAB — TROPONIN I (HIGH SENSITIVITY)
Troponin I (High Sensitivity): 3328 ng/L (ref <18)
Troponin I (High Sensitivity): 2930 ng/L (ref <18)

## 2023-11-30 LAB — HEPARIN LEVEL (UNFRACTIONATED): Heparin Unfractionated: 0.46 [IU]/mL (ref 0.30–0.70)

## 2023-11-30 LAB — CREATININE, SERUM
Creatinine, Ser: 0.98 mg/dL (ref 0.44–1.00)
GFR, Estimated: >60 mL/min (ref >60)

## 2023-11-30 LAB — TSH: TSH: 1.606 u[IU]/mL (ref 0.350–4.500)

## 2023-11-30 LAB — CBG MONITORING, ED: Glucose-Capillary: 244 mg/dL — ABNORMAL HIGH (ref 70–99)

## 2023-11-30 SURGERY — LEFT HEART CATH AND CORONARY ANGIOGRAPHY
Anesthesia: LOCAL

## 2023-11-30 MED ORDER — HEPARIN (PORCINE) IN NACL 1000-0.9 UT/500ML-% IV SOLN
INTRAVENOUS | Status: DC | PRN
  Administered 2023-11-30: 1000 mL

## 2023-11-30 MED ORDER — FENTANYL CITRATE (PF) 100 MCG/2ML IJ SOLN
INTRAMUSCULAR | Status: DC | PRN
  Administered 2023-11-30: 25 ug via INTRAVENOUS

## 2023-11-30 MED ORDER — MIDAZOLAM HCL 2 MG/2ML IJ SOLN
INTRAMUSCULAR | Status: AC
  Filled 2023-11-30: qty 2

## 2023-11-30 MED ORDER — ACETAMINOPHEN 325 MG PO TABS: 650.0000 mg | ORAL_TABLET | ORAL | Status: DC | PRN

## 2023-11-30 MED ORDER — SODIUM CHLORIDE 0.9% FLUSH: 3.0000 mL | INTRAVENOUS | Status: DC | PRN

## 2023-11-30 MED ORDER — LABETALOL HCL 5 MG/ML IV SOLN: 10.0000 mg | INTRAVENOUS | Status: AC | PRN

## 2023-11-30 MED ORDER — SODIUM CHLORIDE 0.9 % IV SOLN: INTRAVENOUS | Status: AC

## 2023-11-30 MED ORDER — HEPARIN SODIUM (PORCINE) 1000 UNIT/ML IJ SOLN
INTRAMUSCULAR | Status: AC
  Filled 2023-11-30: qty 10

## 2023-11-30 MED ORDER — SODIUM CHLORIDE 0.9 % WEIGHT BASED INFUSION
3.0000 mL/kg/h | INTRAVENOUS | Status: AC
  Administered 2023-11-30: 3 mL/kg/h via INTRAVENOUS

## 2023-11-30 MED ORDER — SODIUM CHLORIDE 0.9 % IV SOLN
INTRAVENOUS | Status: AC | PRN
  Administered 2023-11-30: 250 mL via INTRAVENOUS

## 2023-11-30 MED ORDER — FENTANYL CITRATE (PF) 100 MCG/2ML IJ SOLN
INTRAMUSCULAR | Status: AC
  Filled 2023-11-30: qty 2

## 2023-11-30 MED ORDER — HYDRALAZINE HCL 20 MG/ML IJ SOLN: 10.0000 mg | INTRAMUSCULAR | Status: AC | PRN

## 2023-11-30 MED ORDER — HEPARIN (PORCINE) IN NACL 2-0.9 UNITS/ML
INTRAMUSCULAR | Status: DC | PRN
  Administered 2023-11-30: 10 mL via INTRA_ARTERIAL

## 2023-11-30 MED ORDER — LIDOCAINE HCL (PF) 1 % IJ SOLN
INTRAMUSCULAR | Status: DC | PRN
  Administered 2023-11-30: 2 mL

## 2023-11-30 MED ORDER — LIDOCAINE HCL (PF) 1 % IJ SOLN
INTRAMUSCULAR | Status: AC
  Filled 2023-11-30: qty 30

## 2023-11-30 MED ORDER — SODIUM CHLORIDE 0.9% FLUSH
3.0000 mL | Freq: Two times a day (BID) | INTRAVENOUS | Status: DC
  Administered 2023-11-30 – 2023-12-01 (×2): 3 mL via INTRAVENOUS

## 2023-11-30 MED ORDER — EZETIMIBE 10 MG PO TABS
10.0000 mg | ORAL_TABLET | Freq: Every day | ORAL | Status: DC
  Administered 2023-11-30 – 2023-12-01 (×2): 10 mg via ORAL
  Filled 2023-11-30 (×2): qty 1

## 2023-11-30 MED ORDER — SODIUM CHLORIDE 0.9 % WEIGHT BASED INFUSION: 1.0000 mL/kg/h | INTRAVENOUS | Status: DC

## 2023-11-30 MED ORDER — MIDAZOLAM HCL 2 MG/2ML IJ SOLN
INTRAMUSCULAR | Status: DC | PRN
  Administered 2023-11-30: 1 mg via INTRAVENOUS

## 2023-11-30 MED ORDER — HEPARIN SODIUM (PORCINE) 5000 UNIT/ML IJ SOLN
5000.0000 [IU] | Freq: Three times a day (TID) | INTRAMUSCULAR | Status: DC
  Administered 2023-11-30 – 2023-12-01 (×2): 5000 [IU] via SUBCUTANEOUS
  Filled 2023-11-30 (×2): qty 1

## 2023-11-30 MED ORDER — HEPARIN SODIUM (PORCINE) 1000 UNIT/ML IJ SOLN
INTRAMUSCULAR | Status: DC | PRN
  Administered 2023-11-30: 3500 [IU] via INTRAVENOUS

## 2023-11-30 MED ORDER — VERAPAMIL HCL 2.5 MG/ML IV SOLN
INTRAVENOUS | Status: AC
  Filled 2023-11-30: qty 2

## 2023-11-30 MED ORDER — IOHEXOL 350 MG/ML SOLN
INTRAVENOUS | Status: DC | PRN
  Administered 2023-11-30: 50 mL

## 2023-11-30 MED ORDER — SODIUM CHLORIDE 0.9 % IV SOLN: 250.0000 mL | INTRAVENOUS | Status: DC | PRN

## 2023-11-30 SURGICAL SUPPLY — 9 items
CATH INFINITI 5 FR 3DRC (CATHETERS) IMPLANT
CATH INFINITI 5 FR JL3.5 (CATHETERS) IMPLANT
CATH INFINITI JR4 5F (CATHETERS) IMPLANT
DEVICE RAD TR BAND REGULAR (VASCULAR PRODUCTS) IMPLANT
GLIDESHEATH SLEND A-KIT 6F 22G (SHEATH) IMPLANT
GUIDEWIRE INQWIRE 1.5J.035X260 (WIRE) IMPLANT
INQWIRE 1.5J .035X260CM (WIRE) ×1
PACK CARDIAC CATHETERIZATION (CUSTOM PROCEDURE TRAY) ×2 IMPLANT
SET ATX-X65L (MISCELLANEOUS) IMPLANT

## 2023-11-30 NOTE — Progress Notes (Addendum)
 Patient Name: SACRED ROA Date of Encounter: 11/30/2023 [facility] Cardiologist: None   Interval Summary  .    No chest pain since last evening.   Vital Signs .    Vitals:   11/30/23 0200 11/30/23 0400 11/30/23 0456 11/30/23 0600  BP: (!) 108/49 (!) 118/56  (!) 119/49  Pulse: 79 78  65  Resp: 12 14  10   Temp:   97.9 F (36.6 C)   TempSrc:   Oral   SpO2: 96% 96%  99%   No intake or output data in the 24 hours ending 11/30/23 0743    06/17/2023    1:43 PM 04/20/2013    7:51 AM 02/27/2013   11:50 PM  Last 3 Weights  Weight (lbs) 152 lb 174 lb 178 lb 1.6 oz  Weight (kg) 68.947 kg 78.926 kg 80.786 kg      Telemetry/ECG    Sinus Rhythm - Personally Reviewed  Physical Exam .    GEN: No acute distress.  Resting comfortably in bed.  Healthy-appearing. Neck: No JVD or carotid bruit Cardiac: RRR, no murmurs, rubs, or gallops.  Respiratory: Clear to auscultation bilaterally.  Nonlabored, good air movement. GI: Soft, nontender, non-distended  MS: No edema  Assessment & Plan .     73 yo female with PMH of DM who presented with chest pain and found to have NSTEMI  NSTEMI -- presented with chest pain that started around 3am Sunday morning. Was able to go to church but continued to have chest pain and therefore presented to the ED. hsTn 352>>3328, EKG showed sinus rhythm with TWI in lead III, v3.  -- on IV heparin, ASA -- echo pending -- planned for cardiac cath today  Informed Consent   Shared Decision Making/Informed Consent The risks [stroke (1 in 1000), death (1 in 1000), kidney failure [usually temporary] (1 in 500), bleeding (1 in 200), allergic reaction [possibly serious] (1 in 200)], benefits (diagnostic support and management of coronary artery disease) and alternatives of a cardiac catheterization were discussed in detail with Ms. Rathman and she is willing to proceed.     HLD -- LDL 174, reports intolerance to prior statins. May consider addition of  crestor, reported a rash to atorvastatin  -- will need lipid clinic referral as an outpatient   DM-2 -- Hgb A1c pending -- jardiance PTA -- SSI while inpatient   Transient double vision -- reported episode that happened while at church yesterday -- CT head negative   For questions or updates, please contact [facility] Please consult [email or web address] for contact info under        Signed, Laverda Page, NP   ATTENDING ATTESTATION  I have seen, examined and evaluated the patient this morning on rounds along with Laverda Page, NP.  After reviewing all the available data and chart, we discussed the patients laboratory, study & physical findings as well as symptoms in detail.  I agree with her findings, examination as well as impression recommendations as per our discussion.    Attending adjustments noted in italics.   73 year old woman with a history of hyperlipidemia (no longer on statin) along with DM-2 presented with prolonged episode of substernal chest tightness and pressure finally relieved by aspirin and Sabharwal nitroglycerin in the ER.  Troponins of over 3000 and now decreasing consistent with non-STEMI.  EKG with no significant findings besides some subtle ST segment abnormalities/flattening and T wave inversions in the inferior leads as well as V3 through V6.Marland Kitchen  Nonspecific findings..  Exam relatively normal with no obvious murmurs rubs or gallops.  Lungs are clear.  Agree with plan for proceed with cardiac catheterization today.  We discussed the risk benefits alternatives and indications as noted above.  Patient agrees to proceed.    Lipids very poorly controlled.  She stopped taking atorvastatin empirically due to a rash.  Will plan to start rosuvastatin, given level of elevated troponin level referred to the lipid clinic at [facility] visit follow-up.  Hyperglycemic labs today with DM2 history on Januvia-A1c pending. Cover with sliding scale here, but consider  outpatient management with SGLT2 inhibitor.  Further recommendations based on cardiac catheterization findings.   Marykay Lex, MD, MS Bryan Lemma, M.D., M.S. Interventional Cardiologist  [facility]  Pager # [phone] Phone # [phone] [address] [place], [place] [address]

## 2023-11-30 NOTE — Plan of Care (Signed)
   Problem: Coping: Goal: Ability to adjust to condition or change in health will improve Outcome: Progressing   Problem: Fluid Volume: Goal: Ability to maintain a balanced intake and output will improve Outcome: Progressing   Problem: Health Behavior/Discharge Planning: Goal: Ability to identify and utilize available resources and services will improve Outcome: Progressing

## 2023-11-30 NOTE — Care Management Obs Status (Signed)
 MEDICARE OBSERVATION STATUS NOTIFICATION   Patient Details  Name: Lori Shah MRN: [id number] Date of Birth: Feb 08, 1951   Medicare Observation Status Notification Given:  Yes    Leone Haven, RN 11/30/2023, 4:30 PM

## 2023-11-30 NOTE — Progress Notes (Signed)
 Echocardiogram 2D Echocardiogram has been performed.  Lori Shah 11/30/2023, 2:52 PM

## 2023-11-30 NOTE — ED Notes (Signed)
Pt ambulatory to RR with steady gait.

## 2023-11-30 NOTE — Progress Notes (Signed)
 [facility] Progress Note  Patient: Lori Shah    ZOX:[id number]  DOA: 11/29/2023     Date of Service: the patient was seen and examined on 11/30/2023  Chief Complaint  Patient presents with   Chest Pain   Brief hospital course: Lori Shah is a 73 y.o. 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  ED course: Vital signs stable.  CBC unremarkable.  BMP with hyperglycemia to 253, creatinine 1.01 (around her baseline).   RVP: Negative COVID, flu, RSV.  Initial troponin elevated to 352.   EKG showing normal sinus rhythm without any overt acute ischemic changes.  Chest x-ray unremarkable.  CT head performed given transient double vision, negative for any acute intracranial abnormalities.   Patient started on heparin drip.   ED provider consulted cardiology.   [facility] asked to evaluate patient for admission.  HPI details reviewed 2/24 chest pain-free now.  Awaiting for cardiac cath  Assessment and Plan:  # NSTEMI Trop peak 3328 LDL 174, patient is allergic to statin, started Zetia 10 mg p.o. daily Continue aspirin and Nitor PRN Started heparin IV infusion Cardiology consulted, patient is n.p.o. for cardiac cath Follow TTE  # NIDDM T2 Home meds Jardiance and metformin held Started NovoLog sliding scale, monitor CBG Continue diabetic diet  # GERD, continue PPI   Body mass index is 27.78 kg/m.  Interventions:  Diet: N.p.o. after midnight for cardiac cath, start diabetic diet after cath DVT Prophylaxis: Therapeutic Anticoagulation with heparin IV infusion    Advance goals of care discussion: DNR-did  Family Communication: family was not present at bedside, at the time of interview.  The pt provided permission to discuss medical plan with the family. Opportunity was given to ask question and all questions were answered satisfactorily.   Disposition:  Pt is from  Home, admitted with NSTEMI, scheduled for cardiac cath on 2/24, which precludes a safe discharge. Discharge to Home, when stable and cleared by cardiology.  Subjective: No significant events overnight, currently patient is chest pain-free, patient was awaiting for cardiac cath.  Denied any complaints.  Physical Exam: General: NAD, lying comfortably Appear in no distress, affect appropriate Eyes: PERRLA ENT: Oral Mucosa Clear, moist  Neck: no JVD,  Cardiovascular: S1 and S2 Present, no Murmur,  Respiratory: good respiratory effort, Bilateral Air entry equal and Decreased, no Crackles, no wheezes Abdomen: Bowel Sound present, Soft and no tenderness,  Skin: no rashes Extremities: no Pedal edema, no calf tenderness Neurologic: without any new focal findings Gait not checked due to patient safety concerns  Vitals:   11/30/23 1000 11/30/23 1150 11/30/23 1245 11/30/23 1541  BP: 123/65 (!) 116/92 104/73   Pulse: 72 62 66   Resp: 18 15 16    Temp:  98.5 F (36.9 C) 97.9 F (36.6 C)   TempSrc:  Oral Oral   SpO2: 97% 99% 99% 97%  Weight:   68.9 kg   Height:   5\' 2"  (1.575 m)     Intake/Output Summary (Last 24 hours) at 11/30/2023 1602 Last data filed at 11/30/2023 1300 Gross per 24 hour  Intake 0 ml  Output --  Net 0 ml   Filed Weights   11/30/23 0800 11/30/23 0814 11/30/23 1245  Weight: 65 kg 69.9 kg 68.9 kg    Data Reviewed: I have personally reviewed and interpreted daily labs, tele strips, imagings as discussed above. I reviewed all nursing notes,  pharmacy notes, vitals, pertinent old records I have discussed plan of care as described above with RN and patient/family.  CBC: Recent Labs  Lab 11/29/23 1617 11/30/23 0425  WBC 7.5 7.3  NEUTROABS 4.9  --   HGB 15.9* 13.9  HCT 47.4* 42.4  MCV 83.9 83.8  PLT 250 242   Basic Metabolic Panel: Recent Labs  Lab 11/29/23 1617 11/30/23 0425  NA 136 135  K 4.9 3.7  CL 97* 102  CO2 25 21*  GLUCOSE 253* 288*  BUN 13 16   CREATININE 1.01* 1.13*  CALCIUM 9.8 9.3    Studies: ECHOCARDIOGRAM COMPLETE Result Date: 11/30/2023    ECHOCARDIOGRAM REPORT   Patient Name:   Lori Shah Date of Exam: 11/30/2023 Medical Rec #:  [id number]   Height:       62.0 in Accession #:    [id number]  Weight:       151.9 lb Date of Birth:  Jun 22, 1951    BSA:          1.701 m Patient Age:    72 years    BP:           119/49 mmHg Patient Gender: F           HR:           63 bpm. Exam Location:  Inpatient Procedure: 2D Echo, Cardiac Doppler and Color Doppler (Both Spectral and Color            Flow Doppler were utilized during procedure). Indications:    Elevated Troponin  History:        Patient has no prior history of Echocardiogram examinations.                 Previous Myocardial Infarction; Risk Factors:Diabetes and                 Dyslipidemia.  Sonographer:    Lucendia Herrlich RCS Referring Phys: [id number] SAGAR H JINWALA IMPRESSIONS  1. Left ventricular ejection fraction, by estimation, is 60 to 65%. The left ventricle has normal function. The left ventricle has no regional wall motion abnormalities. There is mild concentric left ventricular hypertrophy. Left ventricular diastolic parameters are consistent with Grade I diastolic dysfunction (impaired relaxation).  2. Right ventricular systolic function is normal. The right ventricular size is normal.  3. The mitral valve is normal in structure. Trivial mitral valve regurgitation. No evidence of mitral stenosis.  4. The aortic valve is tricuspid. There is mild calcification of the aortic valve. Aortic valve regurgitation is mild. No aortic stenosis is present.  5. The inferior vena cava is normal in size with greater than 50% respiratory variability, suggesting right atrial pressure of 3 mmHg. Conclusion(s)/Recommendation(s): Possible liver cyst on subcostal images. Consdier dedicated liver imaging. FINDINGS  Left Ventricle: Left ventricular ejection fraction, by estimation, is 60 to 65%. The left  ventricle has normal function. The left ventricle has no regional wall motion abnormalities. Strain imaging was not performed. The left ventricular internal cavity  size was normal in size. There is mild concentric left ventricular hypertrophy. Left ventricular diastolic parameters are consistent with Grade I diastolic dysfunction (impaired relaxation). Right Ventricle: The right ventricular size is normal. No increase in right ventricular wall thickness. Right ventricular systolic function is normal. Left Atrium: Left atrial size was normal in size. Right Atrium: Right atrial size was normal in size. Pericardium: There is no evidence of pericardial effusion. Mitral Valve: The mitral valve is normal in structure. Trivial  mitral valve regurgitation. No evidence of mitral valve stenosis. Tricuspid Valve: The tricuspid valve is normal in structure. Tricuspid valve regurgitation is trivial. No evidence of tricuspid stenosis. Aortic Valve: The aortic valve is tricuspid. There is mild calcification of the aortic valve. Aortic valve regurgitation is mild. Aortic regurgitation PHT measures 676 msec. No aortic stenosis is present. Aortic valve peak gradient measures 8.0 mmHg. Pulmonic Valve: The pulmonic valve was normal in structure. Pulmonic valve regurgitation is not visualized. No evidence of pulmonic stenosis. Aorta: The aortic root is normal in size and structure. Venous: The inferior vena cava is normal in size with greater than 50% respiratory variability, suggesting right atrial pressure of 3 mmHg. IAS/Shunts: No atrial level shunt detected by color flow Doppler. Additional Comments: 3D imaging was not performed.  LEFT VENTRICLE PLAX 2D LVIDd:         4.30 cm   Diastology LVIDs:         2.60 cm   LV e' medial:    5.33 cm/s LV PW:         0.80 cm   LV E/e' medial:  10.2 LV IVS:        0.70 cm   LV e' lateral:   7.40 cm/s LVOT diam:     1.90 cm   LV E/e' lateral: 7.4 LV SV:         52 LV SV Index:   31 LVOT Area:      2.84 cm  RIGHT VENTRICLE             IVC RV S prime:     12.20 cm/s  IVC diam: 1.40 cm TAPSE (M-mode): 1.8 cm LEFT ATRIUM             Index        RIGHT ATRIUM          Index LA diam:        3.30 cm 1.94 cm/m   RA Area:     8.40 cm LA Vol (A2C):   23.1 ml 13.58 ml/m  RA Volume:   14.60 ml 8.58 ml/m LA Vol (A4C):   25.5 ml 14.96 ml/m LA Biplane Vol: 26.3 ml 15.46 ml/m  AORTIC VALVE AV Area (Vmax): 1.71 cm AV Vmax:        141.00 cm/s AV Peak Grad:   8.0 mmHg LVOT Vmax:      85.00 cm/s LVOT Vmean:     58.100 cm/s LVOT VTI:       0.183 m AI PHT:         676 msec  AORTA Ao Root diam: 3.00 cm Ao Asc diam:  3.40 cm MITRAL VALVE MV Area (PHT): 2.42 cm     SHUNTS MV Decel Time: 313 msec     Systemic VTI:  0.18 m MV E velocity: 54.40 cm/s   Systemic Diam: 1.90 cm MV A velocity: 110.00 cm/s MV E/A ratio:  0.49 Arvilla Meres MD Electronically signed by Arvilla Meres MD Signature Date/Time: 11/30/2023/2:57:29 PM    Final    CT Head Wo Contrast Result Date: 11/29/2023 CLINICAL DATA:  Sharp chest pain.  TIA. EXAM: CT HEAD WITHOUT CONTRAST TECHNIQUE: Contiguous axial images were obtained from the base of the skull through the vertex without intravenous contrast. RADIATION DOSE REDUCTION: This exam was performed according to the departmental dose-optimization program which includes automated exposure control, adjustment of the mA and/or kV according to patient size and/or use of iterative reconstruction technique. COMPARISON:  MRI head  03/08/2023 FINDINGS: Brain: No acute intracranial hemorrhage. No focal mass lesion. No CT evidence of acute infarction. No midline shift or mass effect. No hydrocephalus. Basilar cisterns are patent. There are periventricular and subcortical white matter hypodensities. Generalized cortical atrophy. Vascular: No hyperdense vessel or unexpected calcification. Skull: Normal. Negative for fracture or focal lesion. Sinuses/Orbits: Paranasal sinuses and mastoid air cells are clear. Orbits  are clear. Other: Artifact from LEFT earring noted IMPRESSION: 1. No acute intracranial findings. 2. Chronic microvascular ischemia and cortical atrophy. Electronically Signed   By: Genevive Bi M.D.   On: 11/29/2023 18:59    Scheduled Meds:  [MAR Hold] aspirin EC  81 mg Oral Daily   [MAR Hold] empagliflozin  10 mg Oral Daily   [MAR Hold] ezetimibe  10 mg Oral Daily   [MAR Hold] insulin aspart  0-15 Units Subcutaneous TID WC   [MAR Hold] insulin aspart  0-5 Units Subcutaneous QHS   Continuous Infusions:  sodium chloride 250 mL (11/30/23 1548)   sodium chloride 1 mL/kg/hr (11/30/23 1057)   heparin 850 Units/hr (11/29/23 2003)   PRN Meds: sodium chloride, [MAR Hold] acetaminophen **OR** [MAR Hold] acetaminophen, fentaNYL, Heparin (Porcine) in NaCl, heparin sodium (porcine), lidocaine (PF), midazolam, [MAR Hold] ondansetron **OR** [MAR Hold] ondansetron (ZOFRAN) IV, Radial Cocktail/Verapamil only  Time spent: 35 minutes  Author: Gillis Santa. MD [facility] 11/30/2023 4:02 PM  To reach On-call, see care teams to locate the attending and reach out to them via [email or web address]. If 7PM-7AM, please contact night-coverage If you still have difficulty reaching the attending provider, please page the [facility] (Director on Call) for [facility] on amion for assistance.

## 2023-11-30 NOTE — Progress Notes (Addendum)
 PHARMACY - ANTICOAGULATION CONSULT NOTE  Pharmacy Consult for heparin Indication: chest pain/ACS  Allergies  Allergen Reactions   Other Swelling and Other (See Comments)    [place] nuts =  caused swelling of the ears   Penicillins Hives and Swelling   Simvastatin Other (See Comments)    Cramps in stomach   Atorvastatin Rash    Patient Measurements:   Heparin Dosing Weight: 65kg  Vital Signs: Temp: 97.9 F (36.6 C) (02/24 0456) Temp Source: Oral (02/24 0456) BP: 118/56 (02/24 0400) Pulse Rate: 78 (02/24 0400)  Labs: Recent Labs    11/29/23 1617 11/30/23 0117 11/30/23 0425  HGB 15.9*  --  13.9  HCT 47.4*  --  42.4  PLT 250  --  242  HEPARINUNFRC  --   --  0.46  CREATININE 1.01*  --   --   TROPONINIHS 352* 3,328*  --     CrCl cannot be calculated (Unknown ideal weight.).   Medical History: Past Medical History:  Diagnosis Date   Arthritis    hands   Diabetes mellitus    GERD (gastroesophageal reflux disease)    Hyperlipidemia    Nuclear sclerotic cataract of both eyes 03/21/2020    Assessment: Lori Shah is a 73 y.o. year old female admitted on 11/29/2023 with CP and concern for ACS. No anticoagulation prior to admission. Pharmacy consulted to dose heparin.  Heparin level 0.46, therapeutic  No overt s/sx of bleeding. No issues with infusion.  Goal of Therapy:  Heparin level 0.3-0.7 units/ml Monitor platelets by anticoagulation protocol: Yes   Plan:  Continue heparin infusion at 850 units/hr Daily heparin level, CBC, and monitoring for bleeding F/u plans for anticoagulation and cards recs - plans for [facility] 2/24 AM   Thank you for allowing pharmacy to participate in this patient's care.  Marja Kays, PharmD Emergency Medicine Clinical Pharmacist 11/30/2023,5:09 AM

## 2023-11-30 NOTE — Progress Notes (Signed)
 RN removed TR band and covered site with gauze and Tegaderm. Site is a level 0 with no signs of hematoma. Patient educated on post site restrictions. Patient verbalize understanding.

## 2023-11-30 NOTE — TOC CM/SW Note (Signed)
 Transition of Care [facility]) - Inpatient Brief Assessment   Patient Details  Name: Lori Shah MRN: [id number] Date of Birth: 1951-07-25  [facility]) CM/SW Contact:    Leone Haven, RN Phone Number: 11/30/2023, 4:33 PM   Clinical Narrative: From home with spouse, has PCP and insurance on file, states has no HH services in place at this time or DME at home.  States spouse will transport them home at [employer] and family is support system, states gets medications from [facility] on [address].  Pta self ambulatory .   Transition of Care Asessment: Insurance and Status: Insurance coverage has been reviewed Patient has primary care physician: Yes ([facility]) Home environment has been reviewed: live with spouse in house Prior level of function:: indep Prior/Current Home Services: No current home services Social Drivers of Health Review: SDOH reviewed no interventions necessary Readmission risk has been reviewed: Yes Transition of care needs: no transition of care needs at this time

## 2023-12-01 ENCOUNTER — Encounter (HOSPITAL_COMMUNITY): Payer: Self-pay | Admitting: Student

## 2023-12-01 ENCOUNTER — Other Ambulatory Visit (HOSPITAL_COMMUNITY): Payer: Self-pay

## 2023-12-01 DIAGNOSIS — E1159 Type 2 diabetes mellitus with other circulatory complications: Secondary | ICD-10-CM

## 2023-12-01 DIAGNOSIS — I25110 Atherosclerotic heart disease of native coronary artery with unstable angina pectoris: Secondary | ICD-10-CM | POA: Diagnosis not present

## 2023-12-01 DIAGNOSIS — I10 Essential (primary) hypertension: Secondary | ICD-10-CM | POA: Diagnosis not present

## 2023-12-01 DIAGNOSIS — E1169 Type 2 diabetes mellitus with other specified complication: Secondary | ICD-10-CM

## 2023-12-01 DIAGNOSIS — I214 Non-ST elevation (NSTEMI) myocardial infarction: Secondary | ICD-10-CM | POA: Diagnosis not present

## 2023-12-01 LAB — BASIC METABOLIC PANEL
Anion gap: 7 (ref 5–15)
BUN: 15 mg/dL (ref 8–23)
CO2: 21 mmol/L — ABNORMAL LOW (ref 22–32)
Calcium: 8.7 mg/dL — ABNORMAL LOW (ref 8.9–10.3)
Chloride: 108 mmol/L (ref 98–111)
Creatinine, Ser: 1.03 mg/dL — ABNORMAL HIGH (ref 0.44–1.00)
GFR, Estimated: 57 mL/min — ABNORMAL LOW (ref >60)
Glucose, Bld: 153 mg/dL — ABNORMAL HIGH (ref 70–99)
Potassium: 3.5 mmol/L (ref 3.5–5.1)
Sodium: 136 mmol/L (ref 135–145)

## 2023-12-01 LAB — GLUCOSE, CAPILLARY
Glucose-Capillary: 196 mg/dL — ABNORMAL HIGH (ref 70–99)
Glucose-Capillary: 278 mg/dL — ABNORMAL HIGH (ref 70–99)

## 2023-12-01 LAB — MAGNESIUM: Magnesium: 2.2 mg/dL (ref 1.7–2.4)

## 2023-12-01 LAB — HEMOGLOBIN A1C
Hgb A1c MFr Bld: >15.5 % — ABNORMAL HIGH (ref 4.8–5.6)
Mean Plasma Glucose: >398 mg/dL

## 2023-12-01 LAB — PHOSPHORUS: Phosphorus: 3.7 mg/dL (ref 2.5–4.6)

## 2023-12-01 MED ORDER — ROSUVASTATIN CALCIUM 5 MG PO TABS
10.0000 mg | ORAL_TABLET | Freq: Every day | ORAL | Status: DC
  Administered 2023-12-01: 10 mg via ORAL
  Filled 2023-12-01: qty 2

## 2023-12-01 MED ORDER — CLOPIDOGREL BISULFATE 75 MG PO TABS
75.0000 mg | ORAL_TABLET | Freq: Every day | ORAL | 11 refills | Status: DC
  Filled 2023-12-01: qty 30, 30d supply, fill #0

## 2023-12-01 MED ORDER — ASPIRIN 81 MG PO TBEC
81.0000 mg | DELAYED_RELEASE_TABLET | Freq: Every day | ORAL | 12 refills | Status: DC
  Filled 2023-12-01: qty 30, 30d supply, fill #0

## 2023-12-01 MED ORDER — ROSUVASTATIN CALCIUM 10 MG PO TABS
10.0000 mg | ORAL_TABLET | Freq: Every day | ORAL | 11 refills | Status: DC
  Filled 2023-12-01: qty 30, 30d supply, fill #0

## 2023-12-01 MED ORDER — CLOPIDOGREL BISULFATE 75 MG PO TABS: 75.0000 mg | ORAL_TABLET | Freq: Every day | ORAL | Status: DC

## 2023-12-01 MED ORDER — PANTOPRAZOLE SODIUM 40 MG PO TBEC: 40.0000 mg | DELAYED_RELEASE_TABLET | Freq: Every day | ORAL | 2 refills | Status: AC

## 2023-12-01 MED ORDER — EZETIMIBE 10 MG PO TABS
10.0000 mg | ORAL_TABLET | Freq: Every day | ORAL | 11 refills | Status: DC
  Filled 2023-12-01: qty 30, 30d supply, fill #0

## 2023-12-01 MED ORDER — CLOPIDOGREL BISULFATE 75 MG PO TABS
300.0000 mg | ORAL_TABLET | Freq: Once | ORAL | Status: AC
  Administered 2023-12-01: 300 mg via ORAL
  Filled 2023-12-01: qty 4

## 2023-12-01 NOTE — Progress Notes (Signed)
 CARDIAC REHAB PHASE I    Pt ambulating independently in room and hallway with mobility team.  Reports tolerating well with no CP,SOB or dizziness.  Post MI education including restrictions, risk factors, exercise guidelines, antiplatelet therapy importance, MI booklet, NTG use, heart healthy diabetic diet and CRP2 reviewed. All questions and concerns addressed. Will refer to [facility] for CRP2. Plan for possible discharge later today.   [id number] Woodroe Chen, RN BSN 12/01/2023 11:28 AM

## 2023-12-01 NOTE — TOC Transition Note (Signed)
 Transition of Care [facility]) - Discharge Note   Patient Details  Name: Lori Shah MRN: [id number] Date of Birth: 24-Jul-1951  [facility] CM/SW Contact:  Leone Haven, RN Phone Number: 12/01/2023, 10:53 AM   Clinical Narrative:    For possible dc today, she has transport at dc.          Patient Goals and CMS Choice            Discharge Placement                       Discharge Plan and Services Additional resources added to the After Visit Summary for                                       Social Drivers of Health (SDOH) Interventions SDOH Screenings   Food Insecurity: No Food Insecurity (11/30/2023)  Housing: Low Risk  (11/30/2023)  Transportation Needs: No Transportation Needs (11/30/2023)  Utilities: Not At Risk (11/30/2023)  Social Connections: Socially Integrated (11/30/2023)  Tobacco Use: Medium Risk (11/29/2023)     Readmission Risk Interventions     No data to display

## 2023-12-01 NOTE — Progress Notes (Signed)
 Mobility Specialist Progress Note:   12/01/23 1005  Mobility  Activity Ambulated independently in room  Level of Assistance Independent  Assistive Device None  Distance Ambulated (ft) 50 ft  Activity Response Tolerated well  Mobility Referral Yes  Mobility visit 1 Mobility  Mobility Specialist Start Time (ACUTE ONLY) 1005  Mobility Specialist Stop Time (ACUTE ONLY) 1015  Mobility Specialist Time Calculation (min) (ACUTE ONLY) 10 min   Pt received ambulating independently in room with no assistance. Demonstrated steady gait throughout. Denies any symptoms. Pt left ambulating ad lib in room.  Addison Lank Mobility Specialist Please contact via SecureChat or  Rehab office at [phone]

## 2023-12-01 NOTE — Progress Notes (Signed)
 Pt was able to walk the hallway with no symptoms.  MD informed

## 2023-12-01 NOTE — Discharge Summary (Signed)
 [facility] Discharge Summary   Patient: Lori Shah:[id number]  PCP: [facility]  Date of admission: 11/29/2023   Date of discharge:  12/01/2023     Discharge Diagnoses:  Principal Problem:   NSTEMI (non-ST elevated myocardial infarction) [facility]) Active Problems:   Primary hypertension   Type 2 diabetes mellitus (HCC)   Hyperlipidemia associated with type 2 diabetes mellitus (HCC)   Coronary artery disease involving native coronary artery of native heart with unstable angina pectoris (HCC)   Admitted From: Home Disposition:  Home   Recommendations for Outpatient Follow-up:  Follow-up with PCP in 1 week Follow-up with cardiology in 1 to 2 weeks. Follow up LABS/TEST:     Follow-up Information     [facility] at [facility] Follow up.   Why: Please follow up in a week.        Marykay Lex, MD Follow up in 1 month(s).   Specialty: Cardiology Contact information: [address] [place] [address] [phone]                Diet recommendation: Cardiac and Carb modified diet  Activity: The patient is advised to gradually reintroduce usual activities, as tolerated  Discharge Condition: stable  Code Status: Full code   History of present illness: As per the H and P dictated on admission Hospital Course:  Lori Shah is a 73 y.o. 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   ED course: Vital signs stable.  CBC unremarkable.  BMP with hyperglycemia to 253, creatinine 1.01 (around her baseline).   RVP: Negative COVID, flu, RSV.  Initial troponin elevated to 352.   EKG showing normal sinus rhythm without any overt acute ischemic changes.  Chest x-ray unremarkable.  CT head performed given transient double vision, negative for any acute intracranial abnormalities.   Patient started on heparin drip.   ED provider  consulted cardiology.   [facility] asked to evaluate patient for admission.   HPI details reviewed 2/24 chest pain-free now.  Awaiting for cardiac cath   Assessment and Plan:   # NSTEMI Trop peak 3328 LDL 174, patient is allergic to simvastatin and atorvastatin, started Zetia 10 mg p.o. daily.  Started Crestor 10 mg p.o. daily. S/p nitro as needed Continue aspirin, s/p heparin IV infusion.  2/24 TTE LVEF 6065%, grade 1 diastolic dysfunction, no regional [facility]  Cardiology consulted, s/p cardiac cath done on 2/24 revealing to relatively small caliber diagonal branches with significant stenoses not favorable for PCI based on the LAD anatomy. (Recommended medical management with PCI as an option if symptoms are limiting)  2/25 s/p Plavix 3 mg time 1 loading dose given and started Plavix 75 mg p.o. daily.  Cardiology recommended DAPT for at least 12 months.  Follow-up with cardio as an outpatient.  Patient ambulated well in the hallway without any symptoms   # NIDDM T2 Home meds Jardiance and metformin held during hospital stay and resumed on discharge. S/p NovoLog sliding scale.  Patient was advised to monitor CBG at home and continue diabetic diet.  Follow with PCP # GERD, continue PPI   Body mass index is 27.8 kg/m.  Nutrition Interventions:  Patient was ambulatory without any assistance. On the day of the discharge the patient's vitals were stable, and no other acute medical condition were reported by patient. the patient was felt safe to be discharge  at Home .  Consultants: Cardiology Procedures: s/p left heart cath  Discharge Exam: General: Appear in no distress, no Rash; Oral Mucosa Clear, moist. Cardiovascular: S1 and S2 Present, no Murmur, Respiratory: normal respiratory effort, Bilateral Air entry present and no Crackles, no wheezes Abdomen: Bowel Sound present, Soft and no tenderness, no hernia Extremities: no Pedal edema, no calf tenderness Neurology: alert and  oriented to time, place, and person affect appropriate.  Filed Weights   11/30/23 0814 11/30/23 1245 12/01/23 0639  Weight: 69.9 kg 68.9 kg 68.9 kg   Vitals:   12/01/23 0720 12/01/23 1139  BP: 132/68 (!) 119/56  Pulse: 72 66  Resp: 17 15  Temp: 98.4 F (36.9 C) 97.8 F (36.6 C)  SpO2: 97% 97%    DISCHARGE MEDICATION: Allergies as of 12/01/2023       Reactions   Other Swelling, Other (See Comments)   [place] nuts =  caused swelling of the ears   Penicillins Hives, Swelling   Simvastatin Other (See Comments)   Cramps in stomach   Atorvastatin Rash        Medication List     STOP taking these medications    aspirin 81 MG chewable tablet Replaced by: aspirin EC 81 MG tablet   metFORMIN 1000 MG tablet Commonly known as: GLUCOPHAGE       TAKE these medications    aspirin EC 81 MG tablet Take 1 tablet (81 mg total) by mouth daily. Swallow whole. Start taking on: December 02, 2023 Replaces: aspirin 81 MG chewable tablet   clopidogrel 75 MG tablet Commonly known as: PLAVIX Take 1 tablet (75 mg total) by mouth daily. Start taking on: December 02, 2023   ezetimibe 10 MG tablet Commonly known as: ZETIA Take 1 tablet (10 mg total) by mouth daily. Start taking on: December 02, 2023   Jardiance 10 MG Tabs tablet Generic drug: empagliflozin Take 10 mg by mouth daily.   NON FORMULARY Take 1 capsule by mouth See admin instructions. Sea Moss capsules- Take 1 capsule by mouth once a day   NON FORMULARY Take 1 Scoop by mouth See admin instructions. Mushroom coffee- Mix 1 scoopful into a cup of coffee and drink by mouth every other day   pantoprazole 40 MG tablet Commonly known as: PROTONIX Take 1 tablet (40 mg total) by mouth daily. What changed:  medication strength how much to take when to take this   rosuvastatin 10 MG tablet Commonly known as: CRESTOR Take 1 tablet (10 mg total) by mouth daily.   VITAMIN D3 PO Take 1 capsule by mouth daily.        Allergies  Allergen Reactions   Other Swelling and Other (See Comments)    [place]ia nuts =  caused swelling of the ears   Penicillins Hives and Swelling   Simvastatin Other (See Comments)    Cramps in stomach   Atorvastatin Rash   Discharge Instructions     Amb Referral to Cardiac Rehabilitation   Complete by: As directed    Diagnosis: NSTEMI   After initial evaluation and assessments completed: Virtual Based Care may be provided alone or in conjunction with Phase 2 Cardiac Rehab based on patient barriers.: Yes   Intensive Cardiac Rehabilitation (ICR) [facility] location only OR Traditional Cardiac Rehabilitation (TCR) *If criteria for ICR are not met will enroll in TCR [facility] only): Yes   Call MD for:  difficulty breathing, headache or visual disturbances   Complete by: As directed    Call  MD for:  extreme fatigue   Complete by: As directed    Call MD for:  persistant dizziness or light-headedness   Complete by: As directed    Call MD for:  persistant nausea and vomiting   Complete by: As directed    Call MD for:  severe uncontrolled pain   Complete by: As directed    Call MD for:  temperature >100.4   Complete by: As directed    Diet - low sodium heart healthy   Complete by: As directed    Discharge instructions   Complete by: As directed    Follow-up with PCP in 1 week Follow-up with cardiology in 1 to 2 weeks.   Increase activity slowly   Complete by: As directed        The results of significant diagnostics from this hospitalization (including imaging, microbiology, ancillary and laboratory) are listed below for reference.    Significant Diagnostic Studies: CARDIAC CATHETERIZATION Result Date: 11/30/2023 Images from the original result were not included. Coronary angiography 11/30/2023: LM: Normal LAD: Distal diffuse 60% disease          Bifurcating diagonal 1 with proximal 90% stenosis, possibly culprit lesion          Small caliber diagonal 2 with diffuse 80% disease Lcx:  Large dominant vessel.  Mid ectatic area with 50% disease RCA: Small nondominant vessel           Proximal diffuse 60% disease, mid focal 80% disease LVEDP 2 mmHg Culprit could be either 1 of 2 diagonal branches.  With severe tortuosity and small caliber of these vessels, anatomy is not suitable for PCI, especially in the setting of GFR of 51.  There is no severe disease in large epicardial vessels. Patient is chest pain free. Recommend medical management.  Consider DAPT for 1 year with aspirin and Plavix. Elder Negus, MD   ECHOCARDIOGRAM COMPLETE Result Date: 11/30/2023    ECHOCARDIOGRAM REPORT   Patient Name:   MELODEE LUPE Date of Exam: 11/30/2023 Medical Rec #:  [id number]   Height:       62.0 in Accession #:    [id number]  Weight:       151.9 lb Date of Birth:  March 23, 1951    BSA:          1.701 m Patient Age:    72 years    BP:           119/49 mmHg Patient Gender: F           HR:           63 bpm. Exam Location:  Inpatient Procedure: 2D Echo, Cardiac Doppler and Color Doppler (Both Spectral and Color            Flow Doppler were utilized during procedure). Indications:    Elevated Troponin  History:        Patient has no prior history of Echocardiogram examinations.                 Previous Myocardial Infarction; Risk Factors:Diabetes and                 Dyslipidemia.  Sonographer:    Lucendia Herrlich RCS Referring Phys: [id number] SAGAR H JINWALA IMPRESSIONS  1. Left ventricular ejection fraction, by estimation, is 60 to 65%. The left ventricle has normal function. The left ventricle has no regional wall motion abnormalities. There is mild concentric left ventricular hypertrophy. Left ventricular diastolic parameters are consistent with  Grade I diastolic dysfunction (impaired relaxation).  2. Right ventricular systolic function is normal. The right ventricular size is normal.  3. The mitral valve is normal in structure. Trivial mitral valve regurgitation. No evidence of mitral stenosis.  4. The aortic  valve is tricuspid. There is mild calcification of the aortic valve. Aortic valve regurgitation is mild. No aortic stenosis is present.  5. The inferior vena cava is normal in size with greater than 50% respiratory variability, suggesting right atrial pressure of 3 mmHg. Conclusion(s)/Recommendation(s): Possible liver cyst on subcostal images. Consdier dedicated liver imaging. FINDINGS  Left Ventricle: Left ventricular ejection fraction, by estimation, is 60 to 65%. The left ventricle has normal function. The left ventricle has no regional wall motion abnormalities. Strain imaging was not performed. The left ventricular internal cavity  size was normal in size. There is mild concentric left ventricular hypertrophy. Left ventricular diastolic parameters are consistent with Grade I diastolic dysfunction (impaired relaxation). Right Ventricle: The right ventricular size is normal. No increase in right ventricular wall thickness. Right ventricular systolic function is normal. Left Atrium: Left atrial size was normal in size. Right Atrium: Right atrial size was normal in size. Pericardium: There is no evidence of pericardial effusion. Mitral Valve: The mitral valve is normal in structure. Trivial mitral valve regurgitation. No evidence of mitral valve stenosis. Tricuspid Valve: The tricuspid valve is normal in structure. Tricuspid valve regurgitation is trivial. No evidence of tricuspid stenosis. Aortic Valve: The aortic valve is tricuspid. There is mild calcification of the aortic valve. Aortic valve regurgitation is mild. Aortic regurgitation PHT measures 676 msec. No aortic stenosis is present. Aortic valve peak gradient measures 8.0 mmHg. Pulmonic Valve: The pulmonic valve was normal in structure. Pulmonic valve regurgitation is not visualized. No evidence of pulmonic stenosis. Aorta: The aortic root is normal in size and structure. Venous: The inferior vena cava is normal in size with greater than 50% respiratory  variability, suggesting right atrial pressure of 3 mmHg. IAS/Shunts: No atrial level shunt detected by color flow Doppler. Additional Comments: 3D imaging was not performed.  LEFT VENTRICLE PLAX 2D LVIDd:         4.30 cm   Diastology LVIDs:         2.60 cm   LV e' medial:    5.33 cm/s LV PW:         0.80 cm   LV E/e' medial:  10.2 LV IVS:        0.70 cm   LV e' lateral:   7.40 cm/s LVOT diam:     1.90 cm   LV E/e' lateral: 7.4 LV SV:         52 LV SV Index:   31 LVOT Area:     2.84 cm  RIGHT VENTRICLE             IVC RV S prime:     12.20 cm/s  IVC diam: 1.40 cm TAPSE (M-mode): 1.8 cm LEFT ATRIUM             Index        RIGHT ATRIUM          Index LA diam:        3.30 cm 1.94 cm/m   RA Area:     8.40 cm LA Vol (A2C):   23.1 ml 13.58 ml/m  RA Volume:   14.60 ml 8.58 ml/m LA Vol (A4C):   25.5 ml 14.96 ml/m LA Biplane Vol: 26.3 ml 15.46 ml/m  AORTIC VALVE AV Area (Vmax): 1.71 cm AV Vmax:        141.00 cm/s AV Peak Grad:   8.0 mmHg LVOT Vmax:      85.00 cm/s LVOT Vmean:     58.100 cm/s LVOT VTI:       0.183 m AI PHT:         676 msec  AORTA Ao Root diam: 3.00 cm Ao Asc diam:  3.40 cm MITRAL VALVE MV Area (PHT): 2.42 cm     SHUNTS MV Decel Time: 313 msec     Systemic VTI:  0.18 m MV E velocity: 54.40 cm/s   Systemic Diam: 1.90 cm MV A velocity: 110.00 cm/s MV E/A ratio:  0.49 Arvilla Meres MD Electronically signed by Arvilla Meres MD Signature Date/Time: 11/30/2023/2:57:29 PM    Final    CT Head Wo Contrast Result Date: 11/29/2023 CLINICAL DATA:  Sharp chest pain.  TIA. EXAM: CT HEAD WITHOUT CONTRAST TECHNIQUE: Contiguous axial images were obtained from the base of the skull through the vertex without intravenous contrast. RADIATION DOSE REDUCTION: This exam was performed according to the departmental dose-optimization program which includes automated exposure control, adjustment of the mA and/or kV according to patient size and/or use of iterative reconstruction technique. COMPARISON:  MRI head  03/08/2023 FINDINGS: Brain: No acute intracranial hemorrhage. No focal mass lesion. No CT evidence of acute infarction. No midline shift or mass effect. No hydrocephalus. Basilar cisterns are patent. There are periventricular and subcortical white matter hypodensities. Generalized cortical atrophy. Vascular: No hyperdense vessel or unexpected calcification. Skull: Normal. Negative for fracture or focal lesion. Sinuses/Orbits: Paranasal sinuses and mastoid air cells are clear. Orbits are clear. Other: Artifact from LEFT earring noted IMPRESSION: 1. No acute intracranial findings. 2. Chronic microvascular ischemia and cortical atrophy. Electronically Signed   By: Genevive Bi M.D.   On: 11/29/2023 18:59   DG Chest 2 View Result Date: 11/29/2023 CLINICAL DATA:  Chest pain EXAM: CHEST - 2 VIEW COMPARISON:  None Available. FINDINGS: Normal lung volumes. No focal consolidations. No pleural effusion or pneumothorax. The heart size and mediastinal contours are within normal limits. No acute osseous abnormality. IMPRESSION: No active cardiopulmonary disease. Electronically Signed   By: Agustin Cree M.D.   On: 11/29/2023 14:33    Microbiology: Recent Results (from the past 240 hours)  Resp panel by RT-PCR (RSV, Flu A&B, Covid) Anterior Nasal Swab     Status: None   Collection Time: 11/29/23  4:50 PM   Specimen: Anterior Nasal Swab  Result Value Ref Range Status   SARS Coronavirus 2 by RT PCR NEGATIVE NEGATIVE Final   Influenza A by PCR NEGATIVE NEGATIVE Final   Influenza B by PCR NEGATIVE NEGATIVE Final    Comment: (NOTE) The Xpert Xpress SARS-CoV-2/FLU/RSV plus assay is intended as an aid in the diagnosis of influenza from Nasopharyngeal swab specimens and should not be used as a sole basis for treatment. Nasal washings and aspirates are unacceptable for Xpert Xpress SARS-CoV-2/FLU/RSV testing.  Fact Sheet for Patients: BloggerCourse.com  Fact Sheet for Healthcare  Providers: SeriousBroker.it  This test is not yet approved or cleared by the [place] FDA and has been authorized for detection and/or diagnosis of SARS-CoV-2 by FDA under an Emergency Use Authorization (EUA). This EUA will remain in effect (meaning this test can be used) for the duration of the COVID-19 declaration under Section 564(b)(1) of the Act, 21 U.S.C. section 360bbb-3(b)(1), unless the authorization is terminated or revoked.  Resp Syncytial Virus by PCR NEGATIVE NEGATIVE Final    Comment: (NOTE) Fact Sheet for Patients: BloggerCourse.com  Fact Sheet for Healthcare Providers: SeriousBroker.it  This test is not yet approved or cleared by the [place] FDA and has been authorized for detection and/or diagnosis of SARS-CoV-2 by FDA under an Emergency Use Authorization (EUA). This EUA will remain in effect (meaning this test can be used) for the duration of the COVID-19 declaration under Section 564(b)(1) of the Act, 21 U.S.C. section 360bbb-3(b)(1), unless the authorization is terminated or revoked.  Performed at [facility], [address] [place], [place] [address]      Labs: CBC: Recent Labs  Lab 11/29/23 1617 11/30/23 0425 11/30/23 1627  WBC 7.5 7.3 6.4  NEUTROABS 4.9  --   --   HGB 15.9* 13.9 13.7  HCT 47.4* 42.4 42.1  MCV 83.9 83.8 84.4  PLT 250 242 203   Basic Metabolic Panel: Recent Labs  Lab 11/29/23 1617 11/30/23 0425 11/30/23 1629 12/01/23 0326  NA 136 135  --  136  K 4.9 3.7  --  3.5  CL 97* 102  --  108  CO2 25 21*  --  21*  GLUCOSE 253* 288*  --  153*  BUN 13 16  --  15  CREATININE 1.01* 1.13* 0.98 1.03*  CALCIUM 9.8 9.3  --  8.7*  MG  --   --   --  2.2  PHOS  --   --   --  3.7   Liver Function Tests: No results for input(s): "AST", "ALT", "ALKPHOS", "BILITOT", "PROT", "ALBUMIN" in the last 168 hours. No results for input(s): "LIPASE",  "AMYLASE" in the last 168 hours. No results for input(s): "AMMONIA" in the last 168 hours. Cardiac Enzymes: No results for input(s): "CKTOTAL", "CKMB", "CKMBINDEX", "TROPONINI" in the last 168 hours. BNP (last 3 results) No results for input(s): "BNP" in the last 8760 hours. CBG: Recent Labs  Lab 11/30/23 1258 11/30/23 1508 11/30/23 2124 12/01/23 0539 12/01/23 1053  GLUCAP 126* 95 275* 196* 278*    Time spent: 35 minutes  Signed:  Gillis Santa  [facility] 12/01/2023 12:58 PM

## 2023-12-01 NOTE — Progress Notes (Signed)
 Patient Name: Lori Shah Date of Encounter: 12/01/2023 [facility] Cardiologist: Bryan Lemma, MD new  Patient Profile    73 year old woman with history of DM-2, HTN and HLD seen in the ER on 11/29/2023 following an episode of prolonged chest pain and ruling in for non-STEMI.  Interval Summary  .    Underwent cardiac catheterization yesterday revealing to relatively small caliber diagonal branches with significant stenoses not favorable for PCI based on the LAD anatomy.  (Recommended medical management with PCI as an option if symptoms are limiting)  Feels well this AM - no further CP.  Wrist stable  Assessment & Plan .     Principal Problem:   NSTEMI (non-ST elevated myocardial infarction) (HCC) Active Problems:   Coronary artery disease involving native coronary artery of native heart with unstable angina pectoris (HCC)   Type 2 diabetes mellitus (HCC)   Hyperlipidemia associated with type 2 diabetes mellitus (HCC)   Primary hypertension  Principal Problem:   NSTEMI (non-ST elevated myocardial infarction) /   Coronary artery disease involving native coronary artery of native heart with unstable angina pectoris [facility]) Cardiac cath revealed relatively normal large caliber vessels with 2 small caliber diagonal branches with 90 and 80% stenoses as potential culprit lesions.  Not favorable for PCI.  Recommended medical management. With ACS/non-STEMI presentation guidelines would still recommend 1 year DAPT ASA/Plavix Aggressive GDMT with glycemic management, BP control and lipid management-limited by statin myalgias with simvastatin and atorvastatin.     Type 2 diabetes mellitus (HCC)/Hyperlipidemia associated with type 2 diabetes mellitus (HCC) Defer diabetes management to [facility] service-currently on sliding scale insulin along with meal coverage. Will add A1c to labs Currently on empagliflozin, would consider metformin on discharge although metformin would need to be held  48 hours post cath. Will need close follow-up with PCP Lab Results  Component Value Date   CHOL 268 (H) 11/30/2023   HDL 65 11/30/2023   LDLCALC 174 (H) 11/30/2023   TRIG 146 11/30/2023   CHOLHDL 4.1 11/30/2023  Has intolerance listed for both simvastatin and atorvastatin, currently on Zetia 10 mg daily -> will try rosuvastatin, but anticipate that she will need PCSK9 inhibitor versus inclisiran for optimization of control. Outpatient CV[facility] lipid clinic to see during posthospital follow-up3 Will start low dose Rosuvastatin for now    Primary hypertension With CM & CAD - will start low dose ARB -Losartan 25 mg   She is doing relatively today.  She is able to ambulate in the hallway without any issues and no further chest pain.  Anticipate that she can be discharged home.  If she does have angina, then we would need to titrate medications or potentially consider staged PCI of the diagonal branches.    Vital Signs .    Vitals:   11/30/23 2347 12/01/23 0343 12/01/23 0639 12/01/23 0720  BP: (!) 105/50 119/60  132/68  Pulse: 75 76  72  Resp: (!) 30 15  17   Temp: 98.5 F (36.9 C) 98.5 F (36.9 C)  98.4 F (36.9 C)  TempSrc: Oral Oral  Oral  SpO2: 100% 97%  97%  Weight:   68.9 kg   Height:        Intake/Output Summary (Last 24 hours) at 12/01/2023 1043 Last data filed at 12/01/2023 0827 Gross per 24 hour  Intake 1954.43 ml  Output --  Net 1954.43 ml      12/01/2023    6:39 AM 11/30/2023   12:45 PM 11/30/2023  8:14 AM  Last 3 Weights  Weight (lbs) 152 lb 151 lb 14.4 oz 154 lb  Weight (kg) 68.947 kg 68.9 kg 69.854 kg   Physical Exam .   GEN: No acute distress.   Neck: No JVD Cardiac: RRR, normal S1 and S2.  No murmurs, rubs, or gallops.  Respiratory: Clear to auscultation bilaterally. GI: Soft, nontender, non-distended  MS: No edema ; R Rad Cath site c/d/I - no hematoma or bruising  Telemetry/ECG/Cardiology Studies    SR - Personally Reviewed  No new  EKG  Cardiology studies: TTE 12/28/2023: Normal LV size and function with an EF of 60 to 65%.  No RWMA.  GR 1 DD.  Mild concentric LVH.  Normal RV.  Normal MV.  Mild AoV calcification but no stenosis.  Normal RAP.  Possible liver cyst on subcostal images. Cardiac Cath 12/28/2023: LM: Normal; LAD: Distal diffuse 60% disease - Bifurcating D1 with proximal 90% stenosis, possibly culprit lesion; Small caliber D 2 with diffuse 80% disease; Lcx: Large dominant vessel.  Mid ectatic area with 50% disease; RCA: Small nondominant vessel, Proximal diffuse 60% disease, mid focal 80% disease; LVEDP 2 mmHg    Culprit could be either D1 or D2 branches.  With severe tortuosity and small caliber of these vessels, anatomy is not suitable for PCI, especially in the setting of GFR of 51.  There is no severe disease in large epicardial vessels. Patient is chest pain free.   Recommend medical management.  Consider DAPT for 1 year with aspirin and Plavix.   For questions or updates, please contact [facility] Please consult [email or web address] for contact info under        Signed, Bryan Lemma, MD

## 2023-12-02 LAB — HEMOGLOBIN A1C
Hgb A1c MFr Bld: 15.4 % — ABNORMAL HIGH (ref 4.8–5.6)
Mean Plasma Glucose: 395 mg/dL

## 2023-12-02 LAB — LIPOPROTEIN A (LPA): Lipoprotein (a): 83.7 nmol/L — ABNORMAL HIGH (ref <75.0)

## 2023-12-15 ENCOUNTER — Telehealth (HOSPITAL_COMMUNITY): Payer: Self-pay

## 2023-12-15 NOTE — Telephone Encounter (Signed)
 Attempted to call patient in regards to Cardiac Rehab - LM on VM    Placed pt ppw in needs f/u bin.

## 2023-12-17 ENCOUNTER — Telehealth (HOSPITAL_COMMUNITY): Payer: Self-pay

## 2023-12-17 NOTE — Telephone Encounter (Signed)
 Pt returned CR phone call and stated she is interested in CR. Adv pt she will need to schedule and complete her hospital f/u with Dr. Herbie Baltimore. Pt stated she will contact Dr. Herbie Baltimore office today to schedule her f/u.

## 2023-12-29 NOTE — Progress Notes (Unsigned)
 Cardiology Office Note:    Date:  12/30/2023   ID:  [name], DOB [birth year], MRN [id number]  PCP:  Debroah Loop, DO   [facility] Providers Cardiologist:  Bryan Lemma, MD     Referring MD: Debroah Loop, DO   Chief Complaint  Patient presents with   Follow-up    CAD    History of Present Illness:    Lori Shah is a 73 y.o. female with a hx of CAD, hypertension, hyperlipidemia, DM 2.  She was recently hospitalized 12/01/2023 for NSTEMI.  LHC found relatively small caliber diagonal branches with significant stenosis not favorable for PCI based on the LAD anatomy.  Medical management was recommended.  PCI could be an option if symptoms are limiting daily activity.  Lipid panel showed significant elevated LDL at 174.  She is statin intolerant.  Zetia 10 mg daily was continued.  She will likely need PCSK9 inhibitor versus inclisiran.  She presents today for hospital follow up. She is only walking in the house, but there is a second floor in her house. No chest pain. She states her DM has been uncontrolled for 4 years. She has also had double visio and has seen Dr. Luciana Axe for this.     Past Medical History:  Diagnosis Date   Arthritis    hands   Coronary artery disease involving native coronary artery of native heart with unstable angina pectoris (HCC) 12/01/2023   Cardiac Cath 12/28/2023: LM: Normal; LAD: Distal diffuse 60% disease - Bifurcating D1 with proximal 90% stenosis, possibly culprit lesion; Small caliber D 2 with diffuse 80% disease; Lcx: Large dominant vessel.  Mid ectatic area with 50% disease; RCA: Small nondominant vessel, Proximal diffuse 60% disease, mid focal 80% disease; LVEDP 2 mmHg       Culprit could be either D1 or D2 branches.  With se   Diabetes mellitus    GERD (gastroesophageal reflux disease)    Hyperlipidemia    Hyperlipidemia associated with type 2 diabetes mellitus (HCC) 02/27/2013   NSTEMI (non-ST elevated myocardial infarction) (HCC)  11/29/2023   Nuclear sclerotic cataract of both eyes 03/21/2020   Type 2 diabetes mellitus (HCC) 02/27/2013    Past Surgical History:  Procedure Laterality Date   EUS N/A 04/20/2013   Procedure: ESOPHAGEAL ENDOSCOPIC ULTRASOUND (EUS) RADIAL;  Surgeon: Willis Modena, MD;  Location: [facility];  Service: Endoscopy;  Laterality: N/A;   LEFT HEART CATH AND CORONARY ANGIOGRAPHY N/A 11/30/2023   Procedure: LEFT HEART CATH AND CORONARY ANGIOGRAPHY;  Surgeon: Elder Negus, MD;  Location: [facility];  Service: Cardiovascular;  Laterality: N/A;   TONSILLECTOMY     TUBAL LIGATION      Current Medications: Current Meds  Medication Sig   aspirin EC 81 MG tablet Take 1 tablet (81 mg total) by mouth daily. Swallow whole.   Cholecalciferol (VITAMIN D3 PO) Take 1 capsule by mouth daily.   clopidogrel (PLAVIX) 75 MG tablet Take 1 tablet (75 mg total) by mouth daily.   ezetimibe (ZETIA) 10 MG tablet Take 1 tablet (10 mg total) by mouth daily.   Insulin Degludec (TRESIBA) 100 UNIT/ML SOLN Inject 20 Units into the skin daily at 6 (six) AM.   JARDIANCE 10 MG TABS tablet Take 10 mg by mouth daily.   NON FORMULARY Take 1 capsule by mouth See admin instructions. Sea Moss capsules- Take 1 capsule by mouth once a day   NON FORMULARY Take 1 Scoop by mouth See admin instructions.  Mushroom coffee- Mix 1 scoopful into a cup of coffee and drink by mouth every other day   pantoprazole (PROTONIX) 40 MG tablet Take 1 tablet (40 mg total) by mouth daily.   rosuvastatin (CRESTOR) 10 MG tablet Take 1 tablet (10 mg total) by mouth daily.     Allergies:   Other, Penicillins, Simvastatin, and Atorvastatin   Social History   Socioeconomic History   Marital status: Married    Spouse name: Not on file   Number of children: Not on file   Years of education: Not on file   Highest education level: Not on file  Occupational History   Not on file  Tobacco Use   Smoking status: Former    Current  packs/day: 0.50    Average packs/day: 0.5 packs/day for 40.0 years (20.0 ttl pk-yrs)    Types: Cigarettes   Smokeless tobacco: Never  Substance and Sexual Activity   Alcohol use: No   Drug use: No   Sexual activity: Not on file  Other Topics Concern   Not on file  Social History Narrative   Not on file   Social Drivers of Health   Financial Resource Strain: Not on file  Food Insecurity: No Food Insecurity (11/30/2023)   Hunger Vital Sign    Worried About Running Out of Food in the Last Year: Never true    Ran Out of Food in the Last Year: Never true  Transportation Needs: No Transportation Needs (11/30/2023)   PRAPARE - Administrator, Civil Service (Medical): No    Lack of Transportation (Non-Medical): No  Physical Activity: Not on file  Stress: Not on file  Social Connections: Socially Integrated (11/30/2023)   Social Connection and Isolation Panel [NHANES]    Frequency of Communication with Friends and Family: More than three times a week    Frequency of Social Gatherings with Friends and Family: More than three times a week    Attends Religious Services: More than 4 times per year    Active Member of [employer] or Organizations: Yes    Attends Engineer, structural: More than 4 times per year    Marital Status: Married     Family History: The patient's family history is not on file.  ROS:   Please see the history of present illness.     All other systems reviewed and are negative.  EKGs/Labs/Other Studies Reviewed:    The following studies were reviewed today: Cardiac Studies & Procedures   ______________________________________________________________________________________________ CARDIAC CATHETERIZATION  CARDIAC CATHETERIZATION 11/30/2023  Narrative Images from the original result were not included. Coronary angiography 11/30/2023: LM: Normal LAD: Distal diffuse 60% disease Bifurcating diagonal 1 with proximal 90% stenosis, possibly culprit  lesion Small caliber diagonal 2 with diffuse 80% disease Lcx: Large dominant vessel.  Mid ectatic area with 50% disease RCA: Small nondominant vessel Proximal diffuse 60% disease, mid focal 80% disease  LVEDP 2 mmHg    Culprit could be either 1 of 2 diagonal branches.  With severe tortuosity and small caliber of these vessels, anatomy is not suitable for PCI, especially in the setting of GFR of 51.  There is no severe disease in large epicardial vessels. Patient is chest pain free.  Recommend medical management.  Consider DAPT for 1 year with aspirin and Plavix.  Elder Negus, MD  Findings Coronary Findings Diagnostic  Dominance: Left  Left Anterior Descending Dist LAD lesion is 60% stenosed.  First Diagonal Branch 1st Diag lesion is 90% stenosed.  Second Diagonal Branch 2nd Diag lesion is 80% stenosed.  Left Circumflex Mid Cx lesion is 50% stenosed.  Right Coronary Artery Prox RCA lesion is 60% stenosed. Mid RCA lesion is 80% stenosed.  Intervention  No interventions have been documented.     ECHOCARDIOGRAM  ECHOCARDIOGRAM COMPLETE 11/30/2023  Narrative ECHOCARDIOGRAM REPORT    Patient Name:   CASEE KNEPP Date of Exam: 11/30/2023 Medical Rec #:  [id number]   Height:       62.0 in Accession #:    [id number]  Weight:       151.9 lb Date of Birth:  07-28-1951    BSA:          1.701 m Patient Age:    72 years    BP:           119/49 mmHg Patient Gender: F           HR:           63 bpm. Exam Location:  Inpatient  Procedure: 2D Echo, Cardiac Doppler and Color Doppler (Both Spectral and Color Flow Doppler were utilized during procedure).  Indications:    Elevated Troponin  History:        Patient has no prior history of Echocardiogram examinations. Previous Myocardial Infarction; Risk Factors:Diabetes and Dyslipidemia.  Sonographer:    Lucendia Herrlich RCS Referring Phys: [id number] SAGAR H JINWALA  IMPRESSIONS   1. Left ventricular ejection  fraction, by estimation, is 60 to 65%. The left ventricle has normal function. The left ventricle has no regional wall motion abnormalities. There is mild concentric left ventricular hypertrophy. Left ventricular diastolic parameters are consistent with Grade I diastolic dysfunction (impaired relaxation). 2. Right ventricular systolic function is normal. The right ventricular size is normal. 3. The mitral valve is normal in structure. Trivial mitral valve regurgitation. No evidence of mitral stenosis. 4. The aortic valve is tricuspid. There is mild calcification of the aortic valve. Aortic valve regurgitation is mild. No aortic stenosis is present. 5. The inferior vena cava is normal in size with greater than 50% respiratory variability, suggesting right atrial pressure of 3 mmHg.  Conclusion(s)/Recommendation(s): Possible liver cyst on subcostal images. Consdier dedicated liver imaging.  FINDINGS Left Ventricle: Left ventricular ejection fraction, by estimation, is 60 to 65%. The left ventricle has normal function. The left ventricle has no regional wall motion abnormalities. Strain imaging was not performed. The left ventricular internal cavity size was normal in size. There is mild concentric left ventricular hypertrophy. Left ventricular diastolic parameters are consistent with Grade I diastolic dysfunction (impaired relaxation).  Right Ventricle: The right ventricular size is normal. No increase in right ventricular wall thickness. Right ventricular systolic function is normal.  Left Atrium: Left atrial size was normal in size.  Right Atrium: Right atrial size was normal in size.  Pericardium: There is no evidence of pericardial effusion.  Mitral Valve: The mitral valve is normal in structure. Trivial mitral valve regurgitation. No evidence of mitral valve stenosis.  Tricuspid Valve: The tricuspid valve is normal in structure. Tricuspid valve regurgitation is trivial. No evidence of  tricuspid stenosis.  Aortic Valve: The aortic valve is tricuspid. There is mild calcification of the aortic valve. Aortic valve regurgitation is mild. Aortic regurgitation PHT measures 676 msec. No aortic stenosis is present. Aortic valve peak gradient measures 8.0 mmHg.  Pulmonic Valve: The pulmonic valve was normal in structure. Pulmonic valve regurgitation is not visualized. No evidence of pulmonic stenosis.  Aorta: The aortic root is normal  in size and structure.  Venous: The inferior vena cava is normal in size with greater than 50% respiratory variability, suggesting right atrial pressure of 3 mmHg.  IAS/Shunts: No atrial level shunt detected by color flow Doppler.  Additional Comments: 3D imaging was not performed.   LEFT VENTRICLE PLAX 2D LVIDd:         4.30 cm   Diastology LVIDs:         2.60 cm   LV e' medial:    5.33 cm/s LV PW:         0.80 cm   LV E/e' medial:  10.2 LV IVS:        0.70 cm   LV e' lateral:   7.40 cm/s LVOT diam:     1.90 cm   LV E/e' lateral: 7.4 LV SV:         52 LV SV Index:   31 LVOT Area:     2.84 cm   RIGHT VENTRICLE             IVC RV S prime:     12.20 cm/s  IVC diam: 1.40 cm TAPSE (M-mode): 1.8 cm  LEFT ATRIUM             Index        RIGHT ATRIUM          Index LA diam:        3.30 cm 1.94 cm/m   RA Area:     8.40 cm LA Vol (A2C):   23.1 ml 13.58 ml/m  RA Volume:   14.60 ml 8.58 ml/m LA Vol (A4C):   25.5 ml 14.96 ml/m LA Biplane Vol: 26.3 ml 15.46 ml/m AORTIC VALVE AV Area (Vmax): 1.71 cm AV Vmax:        141.00 cm/s AV Peak Grad:   8.0 mmHg LVOT Vmax:      85.00 cm/s LVOT Vmean:     58.100 cm/s LVOT VTI:       0.183 m AI PHT:         676 msec  AORTA Ao Root diam: 3.00 cm Ao Asc diam:  3.40 cm  MITRAL VALVE MV Area (PHT): 2.42 cm     SHUNTS MV Decel Time: 313 msec     Systemic VTI:  0.18 m MV E velocity: 54.40 cm/s   Systemic Diam: 1.90 cm MV A velocity: 110.00 cm/s MV E/A ratio:  0.49  Lori Meres  MD Electronically signed by Lori Meres MD Signature Date/Time: 11/30/2023/2:57:29 PM    Final          ______________________________________________________________________________________________           Recent Labs: 11/30/2023: Hemoglobin 13.7; Platelets 203; TSH 1.606 12/01/2023: BUN 15; Creatinine, Ser 1.03; Magnesium 2.2; Potassium 3.5; Sodium 136  Recent Lipid Panel    Component Value Date/Time   CHOL 268 (H) 11/30/2023 0425   TRIG 146 11/30/2023 0425   HDL 65 11/30/2023 0425   CHOLHDL 4.1 11/30/2023 0425   VLDL 29 11/30/2023 0425   LDLCALC 174 (H) 11/30/2023 0425     Risk Assessment/Calculations:                Physical Exam:    VS:  BP 128/68   Pulse 79   Ht 5\' 2"  (1.575 m)   Wt 159 lb (72.1 kg)   SpO2 98%   BMI 29.08 kg/m     Wt Readings from Last 3 Encounters:  12/30/23 159 lb (72.1 kg)  12/01/23  152 lb (68.9 kg)  06/17/23 152 lb (68.9 kg)     GEN:  Well nourished, well developed in no acute distress HEENT: Normal NECK: No JVD; No carotid bruits LYMPHATICS: No lymphadenopathy CARDIAC: RRR, no murmurs, rubs, gallops RESPIRATORY:  Clear to auscultation without rales, wheezing or rhonchi  ABDOMEN: Soft, non-tender, non-distended MUSCULOSKELETAL:  No edema; No deformity  SKIN: Warm and dry NEUROLOGIC:  Alert and oriented x 3 PSYCHIATRIC:  Normal affect  Right radial cath site C/D/I  ASSESSMENT:    1. Coronary artery disease involving native coronary artery of native heart without angina pectoris   2. Primary hypertension   3. Type 2 diabetes mellitus with other circulatory complication, without long-term current use of insulin (HCC)   4. Hyperlipidemia with target LDL less than 70    PLAN:    In order of problems listed above:  CAD Recent NSTEMI with troponin peak 3328 Medical management for small caliber diagonal branch stenosis -Given ACS presentation, will continue DAPT with aspirin and Plavix for 1 year -- no chest  pain, but heart pounding after climbing stairs, this was happening prior to her NSTEMI   Hyperlipidemia with LDL goal less than 55 - Will attempt lower LDL goal given diabetes with hyperglycemia 11/30/2023: Cholesterol 268; HDL 65; LDL Cholesterol 174; Triglycerides 146; VLDL 29 LPA 83.7 -Started on 10 mg Crestor -Will refer to Pharm.D. for PCSK9 inhibitor versus inclisiran   DM 2 - Already on Jardiance -A1c 15.4% - Will likely need insulin, per PCP   She prefers to see Dr. Herbie Baltimore - will see if we can schedule with him in the next 3 months.  Refer to pharmD for PCSK9i or inclisirin.      Cardiac Rehabilitation Eligibility Assessment  The patient is ready to start cardiac rehabilitation from a cardiac standpoint.          Medication Adjustments/Labs and Tests Ordered: Current medicines are reviewed at length with the patient today.  Concerns regarding medicines are outlined above.  No orders of the defined types were placed in this encounter.  No orders of the defined types were placed in this encounter.   Patient Instructions  Medication Instructions:  Your physician recommends that you continue on your current medications as directed. Please refer to the Current Medication list given to you today.  *If you need a refill on your cardiac medications before your next appointment, please call your pharmacy*   Lab Work: NONE ordered at this time of appointment    Testing/Procedures: NONE ordered at this time of appointment    Follow-Up: At [facility], you and your health needs are our priority.  As part of our continuing mission to provide you with exceptional heart care, we have created designated Provider Care Teams.  These Care Teams include your primary Cardiologist (physician) and Advanced Practice Providers (APPs -  Physician Assistants and Nurse Practitioners) who all work together to provide you with the care you need, when you need it.  We  recommend signing up for the patient portal called "MyChart".  Sign up information is provided on this After Visit Summary.  MyChart is used to connect with patients for Virtual Visits (Telemedicine).  Patients are able to view lab/test results, encounter notes, upcoming appointments, etc.  Non-urgent messages can be sent to your provider as well.   To learn more about what you can do with MyChart, go to [email or web address].    Your next appointment:    Next available Dr. Herbie Baltimore (MD  only)  Provider:   Bryan Lemma, MD     Other Instructions   1st Floor: - Lobby - Registration  - Pharmacy  - Lab - Cafe  2nd Floor: - PV Lab - Diagnostic Testing (echo, CT, nuclear med)  3rd Floor: - Vacant  4th Floor: - TCTS (cardiothoracic surgery) - AFib Clinic - Structural Heart Clinic - Vascular Surgery  - Vascular Ultrasound  5th Floor: - HeartCare Cardiology (general and EP) - Clinical Pharmacy for coumadin, hypertension, lipid, weight-loss medications, and med management appointments    Valet parking services will be available as well.         Signed, Marcelino Duster, [place]  12/30/2023 10:50 AM    [facility]

## 2023-12-30 ENCOUNTER — Encounter: Payer: Self-pay | Admitting: Physician Assistant

## 2023-12-30 ENCOUNTER — Ambulatory Visit: Attending: Physician Assistant | Admitting: Physician Assistant

## 2023-12-30 VITALS — BP 128/68 | HR 79 | Ht 62.0 in | Wt 159.0 lb

## 2023-12-30 DIAGNOSIS — E1159 Type 2 diabetes mellitus with other circulatory complications: Secondary | ICD-10-CM | POA: Diagnosis not present

## 2023-12-30 DIAGNOSIS — E785 Hyperlipidemia, unspecified: Secondary | ICD-10-CM | POA: Diagnosis not present

## 2023-12-30 DIAGNOSIS — I2510 Atherosclerotic heart disease of native coronary artery without angina pectoris: Secondary | ICD-10-CM | POA: Diagnosis not present

## 2023-12-30 DIAGNOSIS — I10 Essential (primary) hypertension: Secondary | ICD-10-CM

## 2023-12-30 DIAGNOSIS — Z5320 Procedure and treatment not carried out because of patient's decision for unspecified reasons: Secondary | ICD-10-CM

## 2023-12-30 NOTE — Addendum Note (Signed)
 Addended by: Lamar Benes on: 12/30/2023 10:59 AM   Modules accepted: Orders

## 2023-12-30 NOTE — Patient Instructions (Addendum)
 Medication Instructions:  Your physician recommends that you continue on your current medications as directed. Please refer to the Current Medication list given to you today.  *If you need a refill on your cardiac medications before your next appointment, please call your pharmacy*   Lab Work: NONE ordered at this time of appointment    Testing/Procedures: NONE ordered at this time of appointment    Follow-Up: At [facility], you and your health needs are our priority.  As part of our continuing mission to provide you with exceptional heart care, we have created designated Provider Care Teams.  These Care Teams include your primary Cardiologist (physician) and Advanced Practice Providers (APPs -  Physician Assistants and Nurse Practitioners) who all work together to provide you with the care you need, when you need it.  We recommend signing up for the patient portal called "MyChart".  Sign up information is provided on this After Visit Summary.  MyChart is used to connect with patients for Virtual Visits (Telemedicine).  Patients are able to view lab/test results, encounter notes, upcoming appointments, etc.  Non-urgent messages can be sent to your provider as well.   To learn more about what you can do with MyChart, go to [email or web address].    Your next appointment:    Next available Dr. Herbie Baltimore (MD only) Pharm-D Referral  Provider:   Bryan Lemma, MD     Other Instructions   1st Floor: - Lobby - Registration  - Pharmacy  - Lab - Cafe  2nd Floor: - PV Lab - Diagnostic Testing (echo, CT, nuclear med)  3rd Floor: - Vacant  4th Floor: - TCTS (cardiothoracic surgery) - AFib Clinic - Structural Heart Clinic - Vascular Surgery  - Vascular Ultrasound  5th Floor: - HeartCare Cardiology (general and EP) - Clinical Pharmacy for coumadin, hypertension, lipid, weight-loss medications, and med management appointments    Valet parking services will be  available as well.

## 2024-01-01 ENCOUNTER — Telehealth: Payer: Self-pay | Admitting: Cardiology

## 2024-01-01 MED ORDER — CLOPIDOGREL BISULFATE 75 MG PO TABS: 75.0000 mg | ORAL_TABLET | Freq: Every day | ORAL | 3 refills | Status: AC

## 2024-01-01 MED ORDER — ROSUVASTATIN CALCIUM 10 MG PO TABS: 10.0000 mg | ORAL_TABLET | Freq: Every day | ORAL | 3 refills | Status: AC

## 2024-01-01 MED ORDER — EZETIMIBE 10 MG PO TABS: 10.0000 mg | ORAL_TABLET | Freq: Every day | ORAL | 3 refills | Status: AC

## 2024-01-01 MED ORDER — ASPIRIN 81 MG PO TBEC: 81.0000 mg | DELAYED_RELEASE_TABLET | Freq: Every day | ORAL | 3 refills | Status: AC

## 2024-01-01 NOTE — Telephone Encounter (Signed)
 *  STAT* If patient is at the pharmacy, call can be transferred to refill team.   1. Which medications need to be refilled? (please list name of each medication and dose if known)           aspirin EC 81 MG tablet    clopidogrel (PLAVIX) 75 MG tablet  ezetimibe (ZETIA) 10 MG tablet  rosuvastatin (CRESTOR) 10 MG tablet   2. Would you like to learn more about the convenience, safety, & potential cost savings by using the [facility]? No      3. Are you open to using the [facility] (Type [facility]. ). No    4. Which pharmacy/location (including street and city if local pharmacy) is medication to be sent to?  [id number] - [place], [place] - [address]     5. Do they need a 30 day or 90 day supply? 90 days   Pt is out of medications

## 2024-01-01 NOTE — Telephone Encounter (Signed)
 Pt's medications were sent to pt's pharmacy as requested. Confirmation received.

## 2024-01-28 ENCOUNTER — Telehealth (HOSPITAL_COMMUNITY): Payer: Self-pay

## 2024-01-28 NOTE — Telephone Encounter (Signed)
 Pt had f/u on 3/26 with cardiologist will pass to nurse navigator for review.

## 2024-02-24 ENCOUNTER — Ambulatory Visit: Admitting: Cardiology

## 2024-03-01 ENCOUNTER — Ambulatory Visit: Attending: Cardiology | Admitting: Pharmacist

## 2024-03-01 ENCOUNTER — Encounter: Payer: Self-pay | Admitting: Pharmacist

## 2024-03-01 DIAGNOSIS — I214 Non-ST elevation (NSTEMI) myocardial infarction: Secondary | ICD-10-CM

## 2024-03-01 DIAGNOSIS — E785 Hyperlipidemia, unspecified: Secondary | ICD-10-CM | POA: Diagnosis not present

## 2024-03-01 DIAGNOSIS — E7841 Elevated Lipoprotein(a): Secondary | ICD-10-CM | POA: Insufficient documentation

## 2024-03-01 DIAGNOSIS — I2510 Atherosclerotic heart disease of native coronary artery without angina pectoris: Secondary | ICD-10-CM

## 2024-03-01 NOTE — Progress Notes (Signed)
 Patient ID: Lori Shah                 DOB: Feb 03, 1951                    MRN: [id number]     HPI: Lori Shah is a 73 y.o. female patient referred to lipid clinic by Romualdo Cobble PA. PMH is significant for CAD, HLD, NSTEMI, HTN, and uncontrolled T2DM.   Patient presents today with sister. Is upset she had to pay a copay this morning.  Currently managed on ezetimibe  10mg  once a day and rosuvastatin  10mg  once a day. Questionable compliance. Does not like taking her medications because she believes they are affecting her memory and interacting with other medications.  Reports she is eating healthy but when questioned she reports drinking sodas daily. Last A1c >15.0%. Recently saw PCP who encouraged her to be compliant with insulin .  Has a treadmill at home but is not using.  Is not willing to try any new medications or increase the medications she is currently taking because she says they do not work. Says the lipid panel drawn in February is too old to make decisions off of.  Current Medications:  Zetia  10mg  daily Rosuvastatin  10mg  daily  Intolerances:  Simvastatin Atorvastatin  Risk Factors:  Hx of NSTEMI Uncontrolled T2DM  CAD HTN  LDL goal: <55  Labs: TC 268, Trigs 146, HDL 65, LDL 174, LPA 83.7 (12/01/23)  Past Medical History:  Diagnosis Date   Arthritis    hands   Coronary artery disease involving native coronary artery of native heart with unstable angina pectoris (HCC) 12/01/2023   Cardiac Cath 12/28/2023: LM: Normal; LAD: Distal diffuse 60% disease - Bifurcating D1 with proximal 90% stenosis, possibly culprit lesion; Small caliber D 2 with diffuse 80% disease; Lcx: Large dominant vessel.  Mid ectatic area with 50% disease; RCA: Small nondominant vessel, Proximal diffuse 60% disease, mid focal 80% disease; LVEDP 2 mmHg       Culprit could be either D1 or D2 branches.  With se   Diabetes mellitus    GERD (gastroesophageal reflux disease)    Hyperlipidemia     Hyperlipidemia associated with type 2 diabetes mellitus (HCC) 02/27/2013   NSTEMI (non-ST elevated myocardial infarction) (HCC) 11/29/2023   Nuclear sclerotic cataract of both eyes 03/21/2020   Type 2 diabetes mellitus (HCC) 02/27/2013    Current Outpatient Medications on File Prior to Visit  Medication Sig Dispense Refill   aspirin  EC 81 MG tablet Take 1 tablet (81 mg total) by mouth daily. Swallow whole. 90 tablet 3   Cholecalciferol (VITAMIN D3 PO) Take 1 capsule by mouth daily.     clopidogrel  (PLAVIX ) 75 MG tablet Take 1 tablet (75 mg total) by mouth daily. 90 tablet 3   ezetimibe  (ZETIA ) 10 MG tablet Take 1 tablet (10 mg total) by mouth daily. 90 tablet 3   Insulin  Degludec (TRESIBA) 100 UNIT/ML SOLN Inject 20 Units into the skin daily at 6 (six) AM.     JARDIANCE  10 MG TABS tablet Take 10 mg by mouth daily.     NON FORMULARY Take 1 capsule by mouth See admin instructions. Sea Moss capsules- Take 1 capsule by mouth once a day     NON FORMULARY Take 1 Scoop by mouth See admin instructions. Mushroom coffee- Mix 1 scoopful into a cup of coffee and drink by mouth every other day     pantoprazole  (PROTONIX ) 40 MG tablet Take 1  tablet (40 mg total) by mouth daily. 30 tablet 2   rosuvastatin  (CRESTOR ) 10 MG tablet Take 1 tablet (10 mg total) by mouth daily. 90 tablet 3   No current facility-administered medications on file prior to visit.    Allergies  Allergen Reactions   Other Swelling and Other (See Comments)    Estonia nuts =  caused swelling of the ears   Penicillins Hives and Swelling   Simvastatin Other (See Comments)    Cramps in stomach   Atorvastatin Rash    Assessment/Plan:  1. Hyperlipidemia -  Patient's last LDL 174 which is above goal of <55. Unfortunately patient is not willing to increase rosuvastatin  or consider PCSK9i. Believes she already takes enough medication and they are not effective. Does not think we can make decisions based on lab work from 3 months ago but  declined having lipid panel rechecked.  Attempted to demonstrate use of Repatha pen but patient declines.  Recommended increasing physical activity to at least 30 minutes a day and to change diet to avoid simple carbohydrates such as sodas. Advised to concentrate on vegetables, lean proteins, healthy fats, and whole grains.   Continue Zetia  10mg  daily Continue rosuvastatin  10mg  daily F/u with Dr Addie Holstein in 1 week  Joelene Murrain, PharmD, BCACP, CDCES, CPP [address], [address] [place], [place], [address] Phone: [phone], Fax: [phone]

## 2024-03-01 NOTE — Patient Instructions (Addendum)
 We would like your LDL (bad cholesterol) to be less than 55  Please continue your ezetimibe  10mg  daily and rosuvastatin  10mg  once a day  Concentrate on increasing vegetables, lean proteins, and whole grains  Try to reduce the amount of sugar (including sodas) you are eating  We recommend at least 30 minutes a day of exercise  Please follow up with Dr Addie Holstein next week  Joelene Murrain, PharmD, BCACP, CDCES, CPP [address] [place], [place], [address] Phone: [phone], Fax: [phone]

## 2024-03-08 ENCOUNTER — Encounter: Payer: Self-pay | Admitting: Cardiology

## 2024-03-08 ENCOUNTER — Ambulatory Visit: Attending: Cardiology | Admitting: Cardiology

## 2024-03-08 VITALS — BP 106/66 | HR 83 | Ht 61.0 in | Wt 153.4 lb

## 2024-03-08 DIAGNOSIS — E1159 Type 2 diabetes mellitus with other circulatory complications: Secondary | ICD-10-CM

## 2024-03-08 DIAGNOSIS — I214 Non-ST elevation (NSTEMI) myocardial infarction: Secondary | ICD-10-CM

## 2024-03-08 DIAGNOSIS — I25110 Atherosclerotic heart disease of native coronary artery with unstable angina pectoris: Secondary | ICD-10-CM | POA: Diagnosis not present

## 2024-03-08 DIAGNOSIS — E1169 Type 2 diabetes mellitus with other specified complication: Secondary | ICD-10-CM

## 2024-03-08 DIAGNOSIS — E7841 Elevated Lipoprotein(a): Secondary | ICD-10-CM

## 2024-03-08 DIAGNOSIS — E785 Hyperlipidemia, unspecified: Secondary | ICD-10-CM

## 2024-03-08 DIAGNOSIS — R2689 Other abnormalities of gait and mobility: Secondary | ICD-10-CM

## 2024-03-08 DIAGNOSIS — I10 Essential (primary) hypertension: Secondary | ICD-10-CM

## 2024-03-08 NOTE — Progress Notes (Unsigned)
 Cardiology Office Note:  .   Date:  03/10/2024  ID:  Lori Shah, DOB 05-24-1951, MRN [id number] PCP: Lori Grounds, DO  [facility] Providers Cardiologist:  Randene Bustard, MD     No chief complaint on file.   Patient Profile: .     Lori Shah is a  73 y.o. female with a PMH notable for uncontrolled DM-2 (A1c 15.4), uncontrolled HLD along with HTN and CAD-non-STEMI who presents here for 38-month follow-up.   Referring provider: Elida Grounds, DO.  PMH: Non-STEMI 11/29/2023: Small branch and distal LAD disease.  => No obvious PCI target.  Recommended medical therapy.     Rhetta Cellar saw Lori Sever, PA on 12/30/2023 for hospital follow-up.  She indicated that she only walks around the house but does occasionally go up and down stairs.  No chest pain.  Noted uncontrolled diabetes for over 4 years and also has double vision.  Recommended continuing DAPT x 1 year, encouraged use of rosuvastatin  and Zetia  and referred to [facility] to discuss PCSK9 inhibitor versus inclisiran based on extremely poor lipid levels.   She was just seen by Lori Shah, RPH from [facility] on 03/01/2024-at that time there is questionable compliance with taking 10 mg ezetimibe  and 10 mg rosuvastatin .  She indicated that she did not like taking medications because she believes they affect her memory.  Had previously had intolerance to other statins.  She says she eats healthy but drinks lots of sodas.  PCP in had also stressed the importance of cutting out sugary sodas and using insulin .  Home treadmill, not using.  Not willing to take medications or increase medications because she says it "does not work.  She also indicated that they labs from February were too old to make decisions, however she declined having new labs checked.  She declined discussion of possible use of PCSK9 inhibitor.   Subjective  Discussed the use of AI scribe software for clinical note transcription with the patient, who gave verbal consent  to proceed.  History of Present Illness Lori Shah is a 73 year old female with myocardial infarction and hyperlipidemia who presents with persistent fatigue and concerns about cholesterol management.  She has been experiencing persistent fatigue since her last hospital visit following a myocardial infarction. No chest pain, pressure, or tightness. No palpitations or shortness of breath when lying down or waking up. She occasionally experiences muscle cramps at night, though not daily.  She is concerned about her cholesterol management. Her last cholesterol levels, taken during her hospital stay, showed a total cholesterol of 268 mg/dL and LDL of 829 mg/dL. She is currently on rosuvastatin  and Zetia  but has not tolerated previous statins like simvastatin and atorvastatin, possibly due to fatigue. She questions the need for new medication due to her persistent tiredness.  She has a history of diabetes with an A1c of 15 and is on insulin  therapy Horace Lye). She acknowledges that drinking sugar sodas is not helping her blood sugar levels. She has attempted to adjust her medication timing, taking diabetic medication in the morning and cholesterol medication at night, to manage her fatigue.  She describes feeling off-balance, particularly when leaning forward or picking up objects, and has experienced dizziness when standing. She stopped smoking a year ago and is not on any blood pressure medications. She drinks plenty of water but also consumes sodas and tea.  Her sleep is generally good, getting about seven hours per night, but she wakes up to use  the bathroom. She feels tired quickly during the day, impacting her ability to stay active. She has not participated in cardiac rehab but tries to use a treadmill for exercise.     Objective   CV meds: Aspirin  81 mg daily, Plavix  75 mg daily, Zetia  10 mg daily, Crestor  10 mg daily, Jardiance  10 mg daily, Tresiba 20 units daily.  Studies Reviewed: Lori Shah   EKG  Interpretation Date/Time:  Tuesday March 08 2024 10:40:48 EDT Ventricular Rate:  83 PR Interval:  164 QRS Duration:  88 QT Interval:  356 QTC Calculation: 418 R Axis:   -12  Text Interpretation: Sinus rhythm with occasional Premature ventricular complexes Possible Left atrial enlargement When compared with ECG of 29-Nov-2023 12:58, Premature ventricular complexes are now Present Confirmed by Randene Bustard ([id number]) on 03/10/2024 11:37:42 PM    ECHO 11/30/2023: (Echo images personally reviewed) 1. Left ventricular ejection fraction, by estimation, is 60 to 65%. The left ventricle has normal function. The left ventricle has no regional  wall motion abnormalities. There is mild concentric left ventricular hypertrophy. Left ventricular diastolic parameters are consistent with Grade I diastolic dysfunction (impaired relaxation).   2. Right ventricular systolic function is normal. The right ventricular size is normal.   3. The mitral valve is normal in structure. Trivial mitral valve regurgitation. No evidence of mitral stenosis.   4. The aortic valve is tricuspid. There is mild calcification of the aortic valve. Aortic valve regurgitation is mild. No aortic stenosis is present.   5. The inferior vena cava is normal in size with greater than 50% respiratory variability, suggesting right atrial pressure of 3 mmHg.   CATH 11/30/2023 (films personally reviewed) LM: Normal; LAD: Distal diffuse 60% disease          Bifurcating diagonal 1 with proximal 90% stenosis, possibly culprit lesion          Small caliber diagonal 2 with diffuse 80% disease; Lcx: Large dominant vessel.  Mid ectatic area with 50% disease RCA: Small nondominant vessel:           Proximal diffuse 60% disease, mid focal 80% disease    LVEDP 2 mmHg      Culprit could be either 1 of 2 diagonal branches.  With severe tortuosity and small caliber of these vessels, anatomy is not suitable for PCI, especially in the setting of GFR of 51.   There is no severe disease in large epicardial vessels. Patient is chest pain free.   Recommend medical management.  Consider DAPT for 1 year with aspirin  and Plavix .  Labs: TC 268, Trigs 146, HDL 65, LDL 174, LPA 83.7; A1c 15.4 (12/01/23)   Risk Assessment/Calculations:          Physical Exam:   VS:  BP 106/66 (BP Location: Right Arm, Patient Position: Sitting, Cuff Size: Normal)   Pulse 83   Ht 5\' 1"  (1.549 m)   Wt 153 lb 6.4 oz (69.6 kg)   BMI 28.98 kg/m    Wt Readings from Last 3 Encounters:  03/08/24 153 lb 6.4 oz (69.6 kg)  12/30/23 159 lb (72.1 kg)  12/01/23 152 lb (68.9 kg)    GEN: Well nourished, well groomed in no acute distress; she is healthy-appearing but just seems to be somewhat defiant in nature.  Was very upset about being told that she needed to start new medicines with no labs being checked. NECK: No JVD; No carotid bruits CARDIAC: Normal S1, S2; RRR, no murmurs, rubs, gallops RESPIRATORY:  Clear to  auscultation without rales, wheezing or rhonchi ; nonlabored, good air movement. ABDOMEN: Soft, non-tender, non-distended EXTREMITIES:  No edema; No deformity      ASSESSMENT AND PLAN: .    Problem List Items Addressed This Visit       Cardiology Problems   Coronary artery disease involving native coronary artery of native heart with unstable angina pectoris (HCC) - Primary (Chronic)   Small vessel CAD noted on cath with no obvious culprit lesion.  No target for PCI as the diagonal branches were quite small.  Thankfully, based on echocardiogram findings, not not likely significant scar tissue. -With ACS presentation, will plan DAPT x 1 year but okay to hold if necessary for procedures, surgeries or significant bleeding/bruising. -Risk factor modification remains key: In her case this is diabetes and hyperlipidemia since her blood pressures are well-controlled. => Check lipids and again anticipate that we will need to use PCSK9 inhibitor versus inclisiran  dramatically controlled lipids.  Stressed that her LDL is not at the level that diet, exercise and low-dose statin plus Zetia  will work. --> With an A1c of 15.4, her diabetes is not controlled. => Will refer to endocrinology       Relevant Orders   Hemoglobin A1c   Lipid panel   Comprehensive metabolic panel with GFR   Ambulatory referral to Endocrinology   Hyperlipidemia associated with type 2 diabetes mellitus (HCC) (Chronic)   Hyperlipidemia LDL at 174, target <55. Current therapy unlikely to achieve target due to statin intolerance. Discussed PCSK9 inhibitors for significant LDL reduction. Emphasized cholesterol control importance. - Order fasting lipid panel to reassess cholesterol levels. - Discuss potential initiation of PCSK9 inhibitors if LDL target is unmet. - Coordinate with pharmacy team for cholesterol-lowering therapy management.  Type 2 diabetes mellitus with hyperglycemia A1c of 15, contributing to fatigue and polydipsia. Managed with Tresiba insulin . Advised on beverage choices to manage hyperglycemia. - Advise switching from sugar-sweetened beverages to water or diet sodas. - Encourage increased water intake to prevent dehydration. - Recommend follow-up with primary care or endocrinologist for diabetes management and insulin  adjustment.      Relevant Orders   Hemoglobin A1c   Lipid panel   Comprehensive metabolic panel with GFR   Ambulatory referral to Endocrinology   NSTEMI (non-ST elevated myocardial infarction) (HCC)   History of myocardial infarction with preserved cardiac function. No current angina or significant symptoms. Emphasized risk factor control to prevent recurrence. - Continue Plavix  and aspirin  for secondary prevention. - Encourage participation in cardiac rehabilitation or regular physical activity.      Relevant Orders   Hemoglobin A1c   Lipid panel   Comprehensive metabolic panel with GFR   Ambulatory referral to Endocrinology   Primary  hypertension (Chronic)   Relevant Orders   EKG 12-Lead (Completed)   Hemoglobin A1c   Lipid panel   Comprehensive metabolic panel with GFR   Ambulatory referral to Endocrinology     Other   Balance disorder   Imbalance possibly related to diabetes and dehydration. Suggested cane use for support. - Encourage increased water intake to prevent dehydration. - Consider use of a cane for support. - Discuss potential referral to vestibular rehabilitation or physical therapy if symptoms persist.      Elevated lipoprotein(a) (Chronic)   LP(a) is also significant elevated placing her at higher risk for recurrent events. So far no effective treatment noted for LP(a) although PCSK9 images have proven to have some benefit.      Relevant Orders  Hemoglobin A1c   Lipid panel   Comprehensive metabolic panel with GFR   Ambulatory referral to Endocrinology   Type 2 diabetes mellitus (HCC) (Chronic)   Relevant Orders   Ambulatory referral to Endocrinology          Follow-Up: Return in about 1 month (around 04/07/2024) for Cholesterol F/u with [facility], To discuss test results, 3-4 month follow-up, Follow-up with APP.   I spent 46 minutes in the care of EMMERIE BATTAGLIA today including reviewing labs (2 minutes reviewing lipids, A1c and discussing results.), reviewing studies (I personally reviewed the Films and echo films-5 minutes), face to face time discussing treatment options (24 minutes), reviewing records from non-STEMI hospitalization, APP and Pharm.D. note (4 minutes), 11 minutes dictating, and documenting in the encounter.      Signed, Arleen Lacer, MD, MS Randene Bustard, M.D., M.S. Interventional Chartered certified accountant  Pager # [phone]

## 2024-03-08 NOTE — Patient Instructions (Addendum)
 Medication Instructions:   No changes *If you need a refill on your cardiac medications before your next appointment, please call your pharmacy*   Lab Work:today  HgbA1c LIPID CMP  If you have labs (blood work) drawn today and your tests are completely normal, you will receive your results only by: MyChart Message (if you have MyChart) OR A paper copy in the mail If you have any lab test that is abnormal or we need to change your treatment, we will call you to review the results.   Testing/Procedures:  Not needed  Follow-Up: At [facility], you and your health needs are our priority.  As part of our continuing mission to provide you with exceptional heart care, we have created designated Provider Care Teams.  These Care Teams include your primary Cardiologist (physician) and Advanced Practice Providers (APPs -  Physician Assistants and Nurse Practitioners) who all work together to provide you with the care you need, when you need it.     Your next appointment:    3 to 4 month(s)  The format for your next appointment:   In Person  Provider:   Marcie Sever, PA-C      Then, Randene Bustard, MD will plan to see you again in 8 month(s).  You have been referred to  [facility]  for elevated HgbA1c

## 2024-03-10 DIAGNOSIS — R2689 Other abnormalities of gait and mobility: Secondary | ICD-10-CM | POA: Insufficient documentation

## 2024-03-10 NOTE — Assessment & Plan Note (Signed)
 Small vessel CAD noted on cath with no obvious culprit lesion.  No target for PCI as the diagonal branches were quite small.  Thankfully, based on echocardiogram findings, not not likely significant scar tissue. -With ACS presentation, will plan DAPT x 1 year but okay to hold if necessary for procedures, surgeries or significant bleeding/bruising. -Risk factor modification remains key: In her case this is diabetes and hyperlipidemia since her blood pressures are well-controlled. => Check lipids and again anticipate that we will need to use PCSK9 inhibitor versus inclisiran dramatically controlled lipids.  Stressed that her LDL is not at the level that diet, exercise and low-dose statin plus Zetia  will work. --> With an A1c of 15.4, her diabetes is not controlled. => Will refer to endocrinology

## 2024-03-10 NOTE — Assessment & Plan Note (Signed)
 Hyperlipidemia LDL at 174, target <55. Current therapy unlikely to achieve target due to statin intolerance. Discussed PCSK9 inhibitors for significant LDL reduction. Emphasized cholesterol control importance. - Order fasting lipid panel to reassess cholesterol levels. - Discuss potential initiation of PCSK9 inhibitors if LDL target is unmet. - Coordinate with pharmacy team for cholesterol-lowering therapy management.  Type 2 diabetes mellitus with hyperglycemia A1c of 15, contributing to fatigue and polydipsia. Managed with Horace Lye insulin . Advised on beverage choices to manage hyperglycemia. - Advise switching from sugar-sweetened beverages to water or diet sodas. - Encourage increased water intake to prevent dehydration. - Recommend follow-up with primary care or endocrinologist for diabetes management and insulin  adjustment.

## 2024-03-10 NOTE — Assessment & Plan Note (Signed)
 LP(a) is also significant elevated placing her at higher risk for recurrent events. So far no effective treatment noted for LP(a) although PCSK9 images have proven to have some benefit.

## 2024-03-10 NOTE — Assessment & Plan Note (Signed)
 Imbalance possibly related to diabetes and dehydration. Suggested cane use for support. - Encourage increased water intake to prevent dehydration. - Consider use of a cane for support. - Discuss potential referral to vestibular rehabilitation or physical therapy if symptoms persist.

## 2024-03-10 NOTE — Assessment & Plan Note (Signed)
 History of myocardial infarction with preserved cardiac function. No current angina or significant symptoms. Emphasized risk factor control to prevent recurrence. - Continue Plavix  and aspirin  for secondary prevention. - Encourage participation in cardiac rehabilitation or regular physical activity.

## 2024-03-12 ENCOUNTER — Encounter (HOSPITAL_COMMUNITY): Payer: Self-pay | Admitting: *Deleted

## 2024-03-12 ENCOUNTER — Ambulatory Visit: Payer: Self-pay | Admitting: Cardiology

## 2024-03-12 ENCOUNTER — Emergency Department (HOSPITAL_COMMUNITY)
Admission: EM | Admit: 2024-03-12 | Discharge: 2024-03-12 | Disposition: A | Discharge status: Home / Self Care | Attending: Emergency Medicine | Admitting: Emergency Medicine

## 2024-03-12 ENCOUNTER — Emergency Department (HOSPITAL_COMMUNITY)

## 2024-03-12 ENCOUNTER — Other Ambulatory Visit: Payer: Self-pay

## 2024-03-12 DIAGNOSIS — Z7901 Long term (current) use of anticoagulants: Secondary | ICD-10-CM | POA: Insufficient documentation

## 2024-03-12 DIAGNOSIS — M62838 Other muscle spasm: Secondary | ICD-10-CM | POA: Diagnosis not present

## 2024-03-12 DIAGNOSIS — E119 Type 2 diabetes mellitus without complications: Secondary | ICD-10-CM | POA: Insufficient documentation

## 2024-03-12 DIAGNOSIS — I2510 Atherosclerotic heart disease of native coronary artery without angina pectoris: Secondary | ICD-10-CM | POA: Diagnosis not present

## 2024-03-12 DIAGNOSIS — R109 Unspecified abdominal pain: Secondary | ICD-10-CM

## 2024-03-12 DIAGNOSIS — Z794 Long term (current) use of insulin: Secondary | ICD-10-CM | POA: Insufficient documentation

## 2024-03-12 DIAGNOSIS — Z7982 Long term (current) use of aspirin: Secondary | ICD-10-CM | POA: Diagnosis not present

## 2024-03-12 LAB — COMPREHENSIVE METABOLIC PANEL WITH GFR
ALT: 14 U/L (ref 0–44)
AST: 18 U/L (ref 15–41)
Albumin: 4 g/dL (ref 3.5–5.0)
Alkaline Phosphatase: 63 U/L (ref 38–126)
Anion gap: 12 (ref 5–15)
BUN: 12 mg/dL (ref 8–23)
CO2: 25 mmol/L (ref 22–32)
Calcium: 9.8 mg/dL (ref 8.9–10.3)
Chloride: 101 mmol/L (ref 98–111)
Creatinine, Ser: 1.1 mg/dL — ABNORMAL HIGH (ref 0.44–1.00)
GFR, Estimated: 53 mL/min — ABNORMAL LOW (ref >60)
Glucose, Bld: 254 mg/dL — ABNORMAL HIGH (ref 70–99)
Potassium: 3.6 mmol/L (ref 3.5–5.1)
Sodium: 138 mmol/L (ref 135–145)
Total Bilirubin: 1.4 mg/dL — ABNORMAL HIGH (ref 0.0–1.2)
Total Protein: 7.1 g/dL (ref 6.5–8.1)

## 2024-03-12 LAB — CBC
HCT: 44.5 % (ref 36.0–46.0)
Hemoglobin: 14.6 g/dL (ref 12.0–15.0)
MCH: 27 pg (ref 26.0–34.0)
MCHC: 32.8 g/dL (ref 30.0–36.0)
MCV: 82.3 fL (ref 80.0–100.0)
Platelets: 225 10*3/uL (ref 150–400)
RBC: 5.41 MIL/uL — ABNORMAL HIGH (ref 3.87–5.11)
RDW: 11.9 % (ref 11.5–15.5)
WBC: 6.3 10*3/uL (ref 4.0–10.5)
nRBC: 0 % (ref 0.0–0.2)

## 2024-03-12 LAB — URINALYSIS, ROUTINE W REFLEX MICROSCOPIC
Bilirubin Urine: NEGATIVE
Glucose, UA: >=500 mg/dL — AB
Hgb urine dipstick: NEGATIVE
Ketones, ur: 5 mg/dL — AB
Nitrite: NEGATIVE
Protein, ur: NEGATIVE mg/dL
Specific Gravity, Urine: 1.025 (ref 1.005–1.030)
pH: 8 (ref 5.0–8.0)

## 2024-03-12 LAB — LIPASE, BLOOD: Lipase: 33 U/L (ref 11–51)

## 2024-03-12 MED ORDER — ONDANSETRON 4 MG PO TBDP
4.0000 mg | ORAL_TABLET | Freq: Once | ORAL | Status: AC | PRN
  Administered 2024-03-12: 4 mg via ORAL
  Filled 2024-03-12: qty 1

## 2024-03-12 MED ORDER — HYDROCODONE-ACETAMINOPHEN 5-325 MG PO TABS: 1.0000 | ORAL_TABLET | Freq: Four times a day (QID) | ORAL | 0 refills | Status: AC | PRN

## 2024-03-12 MED ORDER — LIDOCAINE 5 % EX PTCH: 1.0000 | MEDICATED_PATCH | CUTANEOUS | 0 refills | Status: AC

## 2024-03-12 MED ORDER — MORPHINE SULFATE (PF) 4 MG/ML IV SOLN
4.0000 mg | Freq: Once | INTRAVENOUS | Status: AC
  Administered 2024-03-12: 4 mg via INTRAVENOUS
  Filled 2024-03-12: qty 1

## 2024-03-12 MED ORDER — METHOCARBAMOL 500 MG PO TABS: 500.0000 mg | ORAL_TABLET | Freq: Two times a day (BID) | ORAL | 0 refills | Status: AC

## 2024-03-12 MED ORDER — KETOROLAC TROMETHAMINE 15 MG/ML IJ SOLN
15.0000 mg | Freq: Once | INTRAMUSCULAR | Status: AC
  Administered 2024-03-12: 15 mg via INTRAVENOUS
  Filled 2024-03-12: qty 1

## 2024-03-12 MED ORDER — OXYCODONE-ACETAMINOPHEN 5-325 MG PO TABS
1.0000 | ORAL_TABLET | Freq: Once | ORAL | Status: AC
  Administered 2024-03-12: 1 via ORAL
  Filled 2024-03-12: qty 1

## 2024-03-12 NOTE — ED Provider Notes (Signed)
 [facility] Provider Note   CSN: [id number] Arrival date & time: 03/12/24  1723     History  Chief Complaint  Patient presents with   Flank Pain    Lori Shah is a 73 y.o. female with past medical history significant for diabetes, hyperlipidemia, coronary artery disease, history of pancreatitis, who presents with concern for left-sided flank pain intermittently for last 3 days.  She reports worse with certain positions, she denies any dysuria, questionable hematuria noted at urgent care.  No previous history of kidney stones.  Nothing for pain prior to arrival, Percocet given in triage she reports had minimal effect.     Flank Pain       Home Medications Prior to Admission medications   Medication Sig Start Date End Date Taking? Authorizing Provider  HYDROcodone -acetaminophen  (NORCO/VICODIN) 5-325 MG tablet Take 1 tablet by mouth every 6 (six) hours as needed. 03/12/24  Yes Kristalynn Coddington H, PA-C  lidocaine  (LIDODERM ) 5 % Place 1 patch onto the skin daily. Remove & Discard patch within 12 hours or as directed by MD 03/12/24  Yes Jaselynn Tamas H, PA-C  methocarbamol (ROBAXIN) 500 MG tablet Take 1 tablet (500 mg total) by mouth 2 (two) times daily. 03/12/24  Yes Maiko Salais H, PA-C  aspirin  EC 81 MG tablet Take 1 tablet (81 mg total) by mouth daily. Swallow whole. 01/01/24   [facility], Warren Haber, PA  Cholecalciferol (VITAMIN D3 PO) Take 1 capsule by mouth daily.    [provider]  clopidogrel  (PLAVIX ) 75 MG tablet Take 1 tablet (75 mg total) by mouth daily. 01/01/24   [facility], Warren Haber, PA  ezetimibe  (ZETIA ) 10 MG tablet Take 1 tablet (10 mg total) by mouth daily. 01/01/24   [facility], Warren Haber, PA  Insulin  Degludec (TRESIBA) 100 UNIT/ML SOLN Inject 20 Units into the skin daily at 6 (six) AM. Patient not taking: Reported on 03/08/2024 12/17/23   [provider]  JARDIANCE  10 MG TABS tablet Take 10 mg by mouth  daily. 12/14/20   [provider]  pantoprazole  (PROTONIX ) 40 MG tablet Take 1 tablet (40 mg total) by mouth daily. 12/01/23 02/29/24  Althia Atlas, MD  rosuvastatin  (CRESTOR ) 10 MG tablet Take 1 tablet (10 mg total) by mouth daily. 01/01/24   Lamond Pilot, PA      Allergies    Other, Penicillins, Simvastatin, and Atorvastatin    Review of Systems   Review of Systems  Genitourinary:  Positive for flank pain.  All other systems reviewed and are negative.   Physical Exam Updated Vital Signs BP (!) 142/62 (BP Location: Right Arm)   Pulse 69   Temp 98.2 F (36.8 C)   Resp 16   Ht 5\' 1"  (1.549 m)   Wt 69.6 kg   SpO2 98%   BMI 28.99 kg/m  Physical Exam Vitals and nursing note reviewed.  Constitutional:      General: She is not in acute distress.    Appearance: Normal appearance.  HENT:     Head: Normocephalic and atraumatic.  Eyes:     General:        Right eye: No discharge.        Left eye: No discharge.  Cardiovascular:     Rate and Rhythm: Normal rate and regular rhythm.     Heart sounds: No murmur heard.    No friction rub. No gallop.  Pulmonary:     Effort: Pulmonary effort is normal.  Breath sounds: Normal breath sounds.  Abdominal:     General: Bowel sounds are normal.     Palpations: Abdomen is soft.  Musculoskeletal:     Comments: Focal tenderness in the left flank, radiating towards the left thoracic paraspinous muscles, left lumbar paraspinous muscles.  No significant midline spinal tenderness, negative CVA tenderness, no anterior abdominal tenderness, suprapubic tenderness.  Skin:    General: Skin is warm and dry.     Capillary Refill: Capillary refill takes less than 2 seconds.  Neurological:     Mental Status: She is alert and oriented to person, place, and time.  Psychiatric:        Mood and Affect: Mood normal.        Behavior: Behavior normal.     ED Results / Procedures / Treatments   Labs (all labs ordered are listed, but  only abnormal results are displayed) Labs Reviewed  COMPREHENSIVE METABOLIC PANEL WITH GFR - Abnormal; Notable for the following components:      Result Value   Glucose, Bld 254 (*)    Creatinine, Ser 1.10 (*)    Total Bilirubin 1.4 (*)    GFR, Estimated 53 (*)    All other components within normal limits  CBC - Abnormal; Notable for the following components:   RBC 5.41 (*)    All other components within normal limits  URINALYSIS, ROUTINE W REFLEX MICROSCOPIC - Abnormal; Notable for the following components:   APPearance HAZY (*)    Glucose, UA >=500 (*)    Ketones, ur 5 (*)    Leukocytes,Ua LARGE (*)    Bacteria, UA RARE (*)    All other components within normal limits  URINE CULTURE  LIPASE, BLOOD    EKG None  Radiology CT Renal Stone Study Result Date: 03/12/2024 CLINICAL DATA:  Abdominal and flank pain EXAM: CT ABDOMEN AND PELVIS WITHOUT CONTRAST TECHNIQUE: Multidetector CT imaging of the abdomen and pelvis was performed following the standard protocol without IV contrast. RADIATION DOSE REDUCTION: This exam was performed according to the departmental dose-optimization program which includes automated exposure control, adjustment of the mA and/or kV according to patient size and/or use of iterative reconstruction technique. COMPARISON:  01/03/2021 FINDINGS: Lower chest: No acute pleural or parenchymal lung disease. Hepatobiliary: Unremarkable unenhanced appearance of the liver and gallbladder. No biliary duct dilation. Pancreas: Unremarkable unenhanced appearance. Spleen: Unremarkable unenhanced appearance. Adrenals/Urinary Tract: Stable simple right renal cyst does not require follow-up. No urinary tract calculi or obstructive uropathy within either kidney. The adrenals and bladder are unremarkable. Stomach/Bowel: No bowel obstruction or ileus. Normal appendix right lower quadrant. Scattered diverticulosis of the descending and sigmoid colon without diverticulitis. No bowel wall  thickening or inflammatory change. Vascular/Lymphatic: Aortic atherosclerosis. No enlarged abdominal or pelvic lymph nodes. Reproductive: Uterus and bilateral adnexa are unremarkable. Stable calcified uterine fibroid and bilateral tubal ligation clips. Other: No free fluid or free intraperitoneal gas. No abdominal wall hernia. Musculoskeletal: No acute or destructive bony abnormalities. Reconstructed images demonstrate no additional findings. IMPRESSION: 1. No urinary tract calculi or obstructive uropathy. 2. Scattered distal colonic diverticulosis without diverticulitis. 3.  Aortic Atherosclerosis (ICD10-I70.0). Electronically Signed   By: Bobbye Burrow M.D.   On: 03/12/2024 19:40    Procedures Procedures    Medications Ordered in ED Medications  ondansetron  (ZOFRAN -ODT) disintegrating tablet 4 mg (4 mg Oral Given 03/12/24 1821)  oxyCODONE -acetaminophen  (PERCOCET/ROXICET) 5-325 MG per tablet 1 tablet (1 tablet Oral Given 03/12/24 1823)  morphine  (PF) 4 MG/ML injection 4 mg (  4 mg Intravenous Given 03/12/24 2058)  ketorolac (TORADOL) 15 MG/ML injection 15 mg (15 mg Intravenous Given 03/12/24 2101)    ED Course/ Medical Decision Making/ A&P                                  Medical Decision Making Amount and/or Complexity of Data Reviewed Radiology: ordered.  Risk Prescription drug management.   This patient is a 73 y.o. female  who presents to the ED for concern of flank pain.   Differential diagnoses prior to evaluation: The emergent differential diagnosis includes, but is not limited to,  AAA, renal vascular thrombosis, mesenteric ischemia, pyelonephritis, nephrolithiasis, cystitis, biliary colic, pancreatitis, PUD, appendicitis, diverticulitis, bowel obstruction. This is not an exhaustive differential.   Past Medical History / Co-morbidities / Social History: diabetes, hyperlipidemia, coronary artery disease, history of pancreatitis  Physical Exam: Physical exam performed. The  pertinent findings include: : Focal tenderness in the left flank, radiating towards the left thoracic paraspinous muscles, left lumbar paraspinous muscles.  No significant midline spinal tenderness, negative CVA tenderness, no anterior abdominal tenderness, suprapubic tenderness  Moderate hypertension, blood pressure 142/62.  Lab Tests/Imaging studies: I personally interpreted labs/imaging and the pertinent results include:  CBC overall unremarkable, normal lipase, CMP notable for hyperglycemia, glucose 254, mildly elevated creatinine at 1.1, total bilirubin mildly elevated at 1.4, but liver enzymes otherwise normal range.  UA with large leukocytes, rare bacteria, no white blood cells or red blood cells, no hemoglobin, overall is equivocal for possible early infection, will have discussion with patient regarding treatment versus send for culture first..  Independent interpreted CT stone study which shows no evidence of acute kidney stone or other acute intra-abdominal abnormality explain patient symptoms.  I agree with the radiologist interpretation.   Medications: I ordered medication including Percocet, Toradol, morphine  for pain, on reassessment patient with improvement of her symptoms, discussed muscle relaxant, lidocaine  patches, ibuprofen, Tylenol , considered antibiotics for possible early urinary tract infection, but somewhat equivocal, will send for culture, patient chooses to wait for urine culture.  I have reviewed the patients home medicines and have made adjustments as needed.   Disposition: After consideration of the diagnostic results and the patients response to treatment, I feel that patient stable for discharge with plan as above .   emergency department workup does not suggest an emergent condition requiring admission or immediate intervention beyond what has been performed at this time. The plan is: as above. The patient is safe for discharge and has been instructed to return  immediately for worsening symptoms, change in symptoms or any other concerns.  Final Clinical Impression(s) / ED Diagnoses Final diagnoses:  Left flank pain  Muscle spasm    Rx / DC Orders ED Discharge Orders          Ordered    methocarbamol (ROBAXIN) 500 MG tablet  2 times daily        03/12/24 2124    lidocaine  (LIDODERM ) 5 %  Every 24 hours        03/12/24 2124    HYDROcodone -acetaminophen  (NORCO/VICODIN) 5-325 MG tablet  Every 6 hours PRN        03/12/24 2124              Toua Stites H, PA-C 03/12/24 2125    Kingsley, Victoria K, DO 03/12/24 2319

## 2024-03-12 NOTE — ED Notes (Signed)
 Pt states she is not willing to "push out" urine at this time

## 2024-03-12 NOTE — Discharge Instructions (Addendum)
 Please use Tylenol  or ibuprofen for pain.  You may use 600 mg ibuprofen every 6 hours or 1000 mg of Tylenol  every 6 hours.  You may choose to alternate between the 2.  This would be most effective.  Not to exceed 4 g of Tylenol  within 24 hours.  Not to exceed 3200 mg ibuprofen 24 hours.  You can use the muscle relaxant I am prescribing in addition to the above to help with any breakthrough pain.  You can take it up to twice daily.  It is safe to take at night, but I would be cautious taking it during the day as it can cause some drowsiness.  Make sure that you are feeling awake and alert before you get behind the wheel of a car or operate a motor vehicle.  It is not a narcotic pain medication so you are able to take it if it is not making you drowsy and still pilot a vehicle or machinery safely.  You can use the stronger narcotic pain medication in place of Tylenol  for severe break through pain.  If you take the narcotic pain medication that we prescribed recommend that you also take a laxative such as MiraLAX or Dulcolax every day that you take the narcotic pain medicine, and drink plenty of fluids, 50 to 64 ounces to prevent any constipation.

## 2024-03-12 NOTE — ED Triage Notes (Signed)
 The p[t is c/o rt sided  flank pain for 3 days  she went to [facility] and they sent her here  she was told she had blood in her urine

## 2024-03-14 LAB — COMPREHENSIVE METABOLIC PANEL WITH GFR
ALT: 12 IU/L (ref 0–32)
AST: 17 IU/L (ref 0–40)
Albumin: 4.5 g/dL (ref 3.8–4.8)
Alkaline Phosphatase: 98 IU/L (ref 44–121)
BUN/Creatinine Ratio: 12 (ref 12–28)
BUN: 13 mg/dL (ref 8–27)
Bilirubin Total: 1.1 mg/dL (ref 0.0–1.2)
CO2: 22 mmol/L (ref 20–29)
Calcium: 10.1 mg/dL (ref 8.7–10.3)
Chloride: 101 mmol/L (ref 96–106)
Creatinine, Ser: 1.13 mg/dL — ABNORMAL HIGH (ref 0.57–1.00)
Globulin, Total: 2.8 g/dL (ref 1.5–4.5)
Glucose: 230 mg/dL — ABNORMAL HIGH (ref 70–99)
Potassium: 4.7 mmol/L (ref 3.5–5.2)
Sodium: 143 mmol/L (ref 134–144)
Total Protein: 7.3 g/dL (ref 6.0–8.5)
eGFR: 51 mL/min/{1.73_m2} — ABNORMAL LOW (ref >59)

## 2024-03-14 LAB — LIPID PANEL
Chol/HDL Ratio: 2.4 ratio (ref 0.0–4.4)
Cholesterol, Total: 142 mg/dL (ref 100–199)
HDL: 60 mg/dL (ref >39)
LDL Chol Calc (NIH): 62 mg/dL (ref 0–99)
Triglycerides: 112 mg/dL (ref 0–149)
VLDL Cholesterol Cal: 20 mg/dL (ref 5–40)

## 2024-03-14 LAB — URINE CULTURE

## 2024-03-14 LAB — HEMOGLOBIN A1C
Est. average glucose Bld gHb Est-mCnc: 355 mg/dL
Hgb A1c MFr Bld: 14 % — ABNORMAL HIGH (ref 4.8–5.6)

## 2024-03-18 NOTE — Telephone Encounter (Signed)
 Left message to call the office for results,and to review mychart result

## 2024-03-18 NOTE — Telephone Encounter (Signed)
-----   Message from Randene Bustard sent at 03/12/2024  8:00 PM EDT ----- Follow-up labs show 20 stable kidney function with a creatinine of 1.13.  Blood glucose levels are still high at 230.  Otherwise chemistry panels look fine with normal kidney and liver function.  Hemoglobin A1c is still pending  Lipid panel remarkably looks much better with a total cholesterol of 142 and LDL 62.  As of now continue with current lipid management with the Zetia  and 10 mg rosuvastatin .  Reassess labs in about 6 months.  Randene Bustard, MD

## 2024-05-27 NOTE — Progress Notes (Signed)
 Result will be given at next appt. 06/20/24.

## 2024-06-20 ENCOUNTER — Ambulatory Visit: Attending: Cardiology | Admitting: Cardiology

## 2024-06-21 ENCOUNTER — Encounter: Payer: Self-pay | Admitting: Cardiology

## 2024-08-31 NOTE — Progress Notes (Addendum)
 MARASIA NEWHALL                                          MRN: [id number]   09/19/2024   The [facility] Quality Team Specialist reviewed this patient medical record for the purposes of chart review for care gap closure. The following were reviewed: chart review for care gap closure-glycemic status assessment.    [facility] Quality Team

## 2024-10-11 ENCOUNTER — Encounter: Payer: Self-pay | Admitting: *Deleted

## 2024-10-11 NOTE — Progress Notes (Signed)
 Lori Shah                                          MRN: [id number]   10/11/2024   The [facility] Quality Team Specialist reviewed this patient medical record for the purposes of chart review for care gap closure. The following were reviewed: chart review for care gap closure-glycemic status assessment.    [facility] Quality Team
